# Patient Record
Sex: Female | Born: 1995 | Race: White | Hispanic: No | Marital: Single | Smoking: Never smoker
Health system: Southern US, Community
[De-identification: ages and names within clinical notes are randomized; demographics above are authoritative.]

## PROBLEM LIST (undated history)

## (undated) DIAGNOSIS — G40909 Epilepsy, unspecified, not intractable, without status epilepticus: Secondary | ICD-10-CM

## (undated) DIAGNOSIS — L709 Acne, unspecified: Secondary | ICD-10-CM

## (undated) HISTORY — DX: Acne, unspecified: L70.9

## (undated) HISTORY — PX: APPENDECTOMY: SHX54

## (undated) HISTORY — DX: Epilepsy, unspecified, not intractable, without status epilepticus: G40.909

---
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
## 2017-01-14 ENCOUNTER — Encounter: Payer: Self-pay | Admitting: Emergency Medicine

## 2017-01-14 ENCOUNTER — Observation Stay
Admission: EM | Admit: 2017-01-14 | Discharge: 2017-01-16 | Disposition: A | Discharge status: Home / Self Care | Payer: BC Managed Care – PPO | Attending: Surgery | Admitting: Surgery

## 2017-01-14 DIAGNOSIS — Z79899 Other long term (current) drug therapy: Secondary | ICD-10-CM | POA: Diagnosis not present

## 2017-01-14 DIAGNOSIS — K353 Acute appendicitis with localized peritonitis: Secondary | ICD-10-CM | POA: Diagnosis not present

## 2017-01-14 DIAGNOSIS — K3580 Unspecified acute appendicitis: Secondary | ICD-10-CM | POA: Diagnosis present

## 2017-01-14 DIAGNOSIS — N390 Urinary tract infection, site not specified: Secondary | ICD-10-CM | POA: Diagnosis not present

## 2017-01-14 DIAGNOSIS — K5900 Constipation, unspecified: Secondary | ICD-10-CM | POA: Diagnosis not present

## 2017-01-14 LAB — URINALYSIS, COMPLETE (UACMP) WITH MICROSCOPIC
Bilirubin Urine: NEGATIVE
Glucose, UA: NEGATIVE mg/dL
HGB URINE DIPSTICK: NEGATIVE
Ketones, ur: NEGATIVE mg/dL
Leukocytes, UA: NEGATIVE
Nitrite: NEGATIVE
PH: 6 (ref 5.0–8.0)
Protein, ur: NEGATIVE mg/dL
Specific Gravity, Urine: 1.02 (ref 1.005–1.030)

## 2017-01-14 LAB — POCT PREGNANCY, URINE: Preg Test, Ur: NEGATIVE

## 2017-01-14 NOTE — ED Notes (Signed)
Pt ambulatory to front desk, complains of low abd pain. Pt appears in no acute distress.

## 2017-01-14 NOTE — ED Provider Notes (Signed)
[facility] Emergency Department Provider Note  ____________________________________________   First MD Initiated Contact with Patient 01/14/17 2342     (approximate)  I have reviewed the triage vital signs and the nursing notes.   HISTORY  Chief Complaint Flank Pain    HPI Tammy Chandler is a 21 y.o. female who comes to the emergency department requesting a CT scan of her abdomen. She has had crampy mild constant lower abdominal pain for the past 5 days. Anus is worse when sitting up and moving improved when lying down and resting. Several days ago she went to the [facility] walking [facility] where she reportedly had labs which showed an elevated white count, an x-ray of her abdomen, a urinalysis concerning for infection. She was placed on Keflex but is noted no improvement in her symptoms. She was called back yesterday by a nurse at the clinic who advised her to come to the emergency department for CT scan of the abdomen and pelvis. She denies fevers or chills. Her pain is not postprandial. She denies nausea or vomiting. She has no abdominal surgical history. She has no flank pain. Her pain is. Her bilateral lower quadrants. Her last menstrual period was about one month ago.   History reviewed. No pertinent past medical history.  There are no active problems to display for this patient.   History reviewed. No pertinent surgical history.  Prior to Admission medications   Medication Sig Start Date End Date Taking? Authorizing Provider  Cephalexin (KEFLEX PO) Take by mouth.   Yes [provider]  polyethylene glycol (MIRALAX / GLYCOLAX) packet Take 17 g by mouth daily.   Yes [provider]  Promethazine HCl (PHENERGAN PO) Take by mouth.   Yes [provider]  zonisamide (ZONEGRAN) 100 MG capsule Take 2 capsules by mouth daily. 12/06/16   [provider]    Allergies Patient has no known allergies.  History reviewed. No  pertinent family history.  Social History Social History  Substance Use Topics  . Smoking status: Never Smoker  . Smokeless tobacco: Never Used  . Alcohol use No    Review of Systems Constitutional: No fever/chills Eyes: No visual changes. ENT: No sore throat. Cardiovascular: Denies chest pain. Respiratory: Denies shortness of breath. Gastrointestinal: Positive abdominal pain.  Positive nausea, no vomiting.  No diarrhea.  No constipation. Genitourinary: Negative for dysuria. Musculoskeletal: Negative for back pain. Skin: Negative for rash. Neurological: Negative for headaches, focal weakness or numbness.  10-point ROS otherwise negative.  ____________________________________________   PHYSICAL EXAM:  VITAL SIGNS: ED Triage Vitals  Enc Vitals Group     BP 01/14/17 2041 119/78     Pulse Rate 01/14/17 2041 89     Resp 01/14/17 2041 16     Temp 01/14/17 2041 97.9 F (36.6 C)     Temp Source 01/14/17 2041 Oral     SpO2 01/14/17 2041 99 %     Weight 01/14/17 2042 207 lb (93.9 kg)     Height 01/14/17 2042 5\' 6"  (1.676 m)     Head Circumference --      Peak Flow --      Pain Score 01/14/17 2249 5     Pain Loc --      Pain Edu? --      Excl. in GC? --     Constitutional: Alert and oriented x 4 well appearing nontoxic no diaphoresis speaks in full, clear sentences Eyes: PERRL EOMI. Head: Atraumatic. Nose: No congestion/rhinnorhea.  Mouth/Throat: No trismus Neck: No stridor.   Cardiovascular: Normal rate, regular rhythm. Grossly normal heart sounds.  Good peripheral circulation. Respiratory: Normal respiratory effort.  No retractions. Lungs CTAB and moving good air Gastrointestinal: Soft nondistended mild tenderness right lower quadrant greater than left lower quadrant with no rebound no guarding no peritonitis negative Rovsing's somewhat tender over McBurney's Musculoskeletal: No lower extremity edema   Neurologic:  Normal speech and language. No gross focal neurologic  deficits are appreciated. Skin:  Skin is warm, dry and intact. No rash noted. Psychiatric: Mood and affect are normal. Speech and behavior are normal.    ___________________________________   LABS (all labs ordered are listed, but only abnormal results are displayed)  Labs Reviewed  URINALYSIS, COMPLETE (UACMP) WITH MICROSCOPIC - Abnormal; Notable for the following:       Result Value   Color, Urine YELLOW (*)    APPearance CLEAR (*)    Bacteria, UA RARE (*)    Squamous Epithelial / LPF 0-5 (*)    All other components within normal limits  HEPATIC FUNCTION PANEL - Abnormal; Notable for the following:    Bilirubin, Direct <0.1 (*)    All other components within normal limits  URINE CULTURE  CULTURE, BLOOD (ROUTINE X 2)  CULTURE, BLOOD (ROUTINE X 2)  BASIC METABOLIC PANEL  LIPASE, BLOOD  CBC WITH DIFFERENTIAL/PLATELET  LACTIC ACID, PLASMA  LACTIC ACID, PLASMA  POC URINE PREG, ED  POCT PREGNANCY, URINE  TYPE AND SCREEN    Labs unremarkable no signs of UTI __________________________________________  EKG   ____________________________________________  RADIOLOGY  CT scan confirms retrocecal appendicitis ____________________________________________   PROCEDURES  Procedure(s) performed: no  Procedures  Critical Care performed: yes  CRITICAL CARE Performed by: Merrily Brittle   Total critical care time: 35 minutes  Critical care time was exclusive of separately billable procedures and treating other patients.  Critical care was necessary to treat or prevent imminent or life-threatening deterioration.  Critical care was time spent personally by me on the following activities: development of treatment plan with patient and/or surrogate as well as nursing, discussions with consultants, evaluation of patient's response to treatment, examination of patient, obtaining history from patient or surrogate, ordering and performing treatments and interventions, ordering  and review of laboratory studies, ordering and review of radiographic studies, pulse oximetry and re-evaluation of patient's condition.   Observation: no ____________________________________________   INITIAL IMPRESSION / ASSESSMENT AND PLAN / ED COURSE  Pertinent labs & imaging results that were available during my care of the patient were reviewed by me and considered in my medical decision making (see chart for details).  The patient arrives very well-appearing with a benign abdominal exam. I have a low suspicion for appendicitis, however she has been taking antibiotics for several days which could obscure her symptoms. I offered the patient an abdominal recheck versus labs and an abdominal recheck versus CT scan today and she opted for CT.   _----------------------------------------- 3:11 AM on 01/15/2017 -----------------------------------------  Was called by the radiologist that the patient has acute appendicitis. I then discussed the case with on-call surgeon Dr. Everlene Farrier who will kindly come evaluate the patient.  He requests zosyn. ___________________________________________   FINAL CLINICAL IMPRESSION(S) / ED DIAGNOSES  Final diagnoses:  Acute appendicitis, unspecified acute appendicitis type      NEW MEDICATIONS STARTED DURING THIS VISIT:  New Prescriptions   No medications on file     Note:  This document was prepared using Dragon voice recognition software and may  include unintentional dictation errors.     Merrily Brittle, MD 01/15/17 [phone]

## 2017-01-14 NOTE — ED Triage Notes (Signed)
Pt ambulatory to triage with steady gait, no distress noted. Pt c/o of left and right flank pain since Tuesday. Pt seen at [facility] on 01/12/17, blood, urine and xray performed. Pt diagnosed with UTI, given Keflex, Phenergan and Miralax. Pt to ED today due to symptoms not resolving with medication. Pts last BM today, normal per pt.

## 2017-01-14 NOTE — ED Notes (Signed)
Pt states she would like to get something to eat from vending machine. Explanation of NPO status explained to pt who verbalizes understanding. Pt updated on on delay. Pt verbailzes understanding.

## 2017-01-15 ENCOUNTER — Emergency Department: Payer: BC Managed Care – PPO

## 2017-01-15 ENCOUNTER — Observation Stay: Payer: BC Managed Care – PPO | Admitting: Anesthesiology

## 2017-01-15 ENCOUNTER — Encounter: Payer: Self-pay | Admitting: Radiology

## 2017-01-15 ENCOUNTER — Encounter
Admission: EM | Disposition: A | Discharge status: Home / Self Care | Payer: Self-pay | Source: Home / Self Care | Attending: Emergency Medicine

## 2017-01-15 DIAGNOSIS — K3580 Unspecified acute appendicitis: Secondary | ICD-10-CM | POA: Diagnosis present

## 2017-01-15 DIAGNOSIS — K353 Acute appendicitis with localized peritonitis: Secondary | ICD-10-CM

## 2017-01-15 HISTORY — PX: LAPAROSCOPIC APPENDECTOMY: SHX408

## 2017-01-15 LAB — CBC WITH DIFFERENTIAL/PLATELET
BASOS ABS: 0 10*3/uL (ref 0–0.1)
BASOS PCT: 1 %
EOS PCT: 1 %
Eosinophils Absolute: 0.1 10*3/uL (ref 0–0.7)
HCT: 41.3 % (ref 35.0–47.0)
Hemoglobin: 14.1 g/dL (ref 12.0–16.0)
LYMPHS ABS: 3.2 10*3/uL (ref 1.0–3.6)
Lymphocytes Relative: 40 %
MCH: 29.4 pg (ref 26.0–34.0)
MCHC: 34 g/dL (ref 32.0–36.0)
MCV: 86.5 fL (ref 80.0–100.0)
Monocytes Absolute: 0.8 10*3/uL (ref 0.2–0.9)
Monocytes Relative: 10 %
NEUTROS PCT: 48 %
Neutro Abs: 3.9 10*3/uL (ref 1.4–6.5)
PLATELETS: 253 10*3/uL (ref 150–440)
RBC: 4.78 MIL/uL (ref 3.80–5.20)
RDW: 12.3 % (ref 11.5–14.5)
WBC: 8 10*3/uL (ref 3.6–11.0)

## 2017-01-15 LAB — HEPATIC FUNCTION PANEL
ALBUMIN: 4.3 g/dL (ref 3.5–5.0)
ALT: 22 U/L (ref 14–54)
AST: 16 U/L (ref 15–41)
Alkaline Phosphatase: 68 U/L (ref 38–126)
BILIRUBIN TOTAL: 0.4 mg/dL (ref 0.3–1.2)
Total Protein: 7.5 g/dL (ref 6.5–8.1)

## 2017-01-15 LAB — LACTIC ACID, PLASMA: Lactic Acid, Venous: 0.9 mmol/L (ref 0.5–1.9)

## 2017-01-15 LAB — TYPE AND SCREEN
ABO/RH(D): O NEG
ANTIBODY SCREEN: NEGATIVE

## 2017-01-15 LAB — BASIC METABOLIC PANEL
Anion gap: 5 (ref 5–15)
BUN: 7 mg/dL (ref 6–20)
CHLORIDE: 111 mmol/L (ref 101–111)
CO2: 24 mmol/L (ref 22–32)
CREATININE: 0.63 mg/dL (ref 0.44–1.00)
Calcium: 9.3 mg/dL (ref 8.9–10.3)
Glucose, Bld: 83 mg/dL (ref 65–99)
POTASSIUM: 4.2 mmol/L (ref 3.5–5.1)
SODIUM: 140 mmol/L (ref 135–145)

## 2017-01-15 LAB — LIPASE, BLOOD: LIPASE: 17 U/L (ref 11–51)

## 2017-01-15 SURGERY — APPENDECTOMY, LAPAROSCOPIC
Anesthesia: General | Wound class: Contaminated

## 2017-01-15 MED ORDER — POLYETHYLENE GLYCOL 3350 17 G PO PACK: 17.0000 g | PACK | Freq: Every day | ORAL | Status: DC | PRN

## 2017-01-15 MED ORDER — FENTANYL CITRATE (PF) 100 MCG/2ML IJ SOLN
25.0000 ug | INTRAMUSCULAR | Status: DC | PRN
  Administered 2017-01-15 (×2): 25 ug via INTRAVENOUS

## 2017-01-15 MED ORDER — GLYCOPYRROLATE 0.2 MG/ML IJ SOLN
INTRAMUSCULAR | Status: AC
  Filled 2017-01-15: qty 2

## 2017-01-15 MED ORDER — DEXAMETHASONE SODIUM PHOSPHATE 10 MG/ML IJ SOLN
INTRAMUSCULAR | Status: AC
  Filled 2017-01-15: qty 1

## 2017-01-15 MED ORDER — ACETAMINOPHEN 500 MG PO TABS
1000.0000 mg | ORAL_TABLET | Freq: Four times a day (QID) | ORAL | Status: DC
  Administered 2017-01-15 – 2017-01-16 (×4): 1000 mg via ORAL
  Filled 2017-01-15 (×4): qty 2

## 2017-01-15 MED ORDER — MIDAZOLAM HCL 2 MG/2ML IJ SOLN
INTRAMUSCULAR | Status: AC
  Filled 2017-01-15: qty 2

## 2017-01-15 MED ORDER — BUPIVACAINE-EPINEPHRINE (PF) 0.5% -1:200000 IJ SOLN
INTRAMUSCULAR | Status: DC | PRN
  Administered 2017-01-15: 20 mL

## 2017-01-15 MED ORDER — MIDAZOLAM HCL 2 MG/2ML IJ SOLN
INTRAMUSCULAR | Status: DC | PRN
  Administered 2017-01-15: 2 mg via INTRAVENOUS

## 2017-01-15 MED ORDER — PROPOFOL 10 MG/ML IV BOLUS
INTRAVENOUS | Status: DC | PRN
  Administered 2017-01-15: 180 mg via INTRAVENOUS

## 2017-01-15 MED ORDER — HYDROMORPHONE HCL 1 MG/ML IJ SOLN
0.5000 mg | INTRAMUSCULAR | Status: DC | PRN
  Filled 2017-01-15: qty 0.5

## 2017-01-15 MED ORDER — ROCURONIUM BROMIDE 100 MG/10ML IV SOLN
INTRAVENOUS | Status: AC
  Filled 2017-01-15: qty 1

## 2017-01-15 MED ORDER — PIPERACILLIN-TAZOBACTAM 3.375 G IVPB 30 MIN
3.3750 g | Freq: Three times a day (TID) | INTRAVENOUS | Status: DC
  Administered 2017-01-15 – 2017-01-16 (×3): 3.375 g via INTRAVENOUS
  Filled 2017-01-15 (×6): qty 50

## 2017-01-15 MED ORDER — ONDANSETRON HCL 4 MG/2ML IJ SOLN: 4.0000 mg | Freq: Four times a day (QID) | INTRAMUSCULAR | Status: DC | PRN

## 2017-01-15 MED ORDER — NEOSTIGMINE METHYLSULFATE 10 MG/10ML IV SOLN
INTRAVENOUS | Status: DC | PRN
  Administered 2017-01-15: 4 mg via INTRAVENOUS

## 2017-01-15 MED ORDER — LACTATED RINGERS IV SOLN
125.0000 mL/h | INTRAVENOUS | Status: DC
  Administered 2017-01-15: 06:00:00 via INTRAVENOUS
  Administered 2017-01-15: 125 mL/h via INTRAVENOUS
  Administered 2017-01-15: 05:00:00 via INTRAVENOUS

## 2017-01-15 MED ORDER — GLYCOPYRROLATE 0.2 MG/ML IJ SOLN
INTRAMUSCULAR | Status: DC | PRN
  Administered 2017-01-15: 0.4 mg via INTRAVENOUS

## 2017-01-15 MED ORDER — KETOROLAC TROMETHAMINE 30 MG/ML IJ SOLN
30.0000 mg | Freq: Four times a day (QID) | INTRAMUSCULAR | Status: DC
  Administered 2017-01-15 – 2017-01-16 (×4): 30 mg via INTRAVENOUS
  Filled 2017-01-15 (×4): qty 1

## 2017-01-15 MED ORDER — OXYCODONE HCL 5 MG PO TABS: 5.0000 mg | ORAL_TABLET | Freq: Once | ORAL | Status: DC | PRN

## 2017-01-15 MED ORDER — SUCCINYLCHOLINE CHLORIDE 20 MG/ML IJ SOLN
INTRAMUSCULAR | Status: AC
  Filled 2017-01-15: qty 1

## 2017-01-15 MED ORDER — PANTOPRAZOLE SODIUM 40 MG IV SOLR
40.0000 mg | Freq: Every day | INTRAVENOUS | Status: DC
  Administered 2017-01-15: 40 mg via INTRAVENOUS
  Filled 2017-01-15: qty 40

## 2017-01-15 MED ORDER — ZONISAMIDE 100 MG PO CAPS
200.0000 mg | ORAL_CAPSULE | Freq: Every day | ORAL | Status: DC
  Filled 2017-01-15: qty 2

## 2017-01-15 MED ORDER — SUCCINYLCHOLINE CHLORIDE 20 MG/ML IJ SOLN
INTRAMUSCULAR | Status: DC | PRN
  Administered 2017-01-15: 100 mg via INTRAVENOUS

## 2017-01-15 MED ORDER — IOPAMIDOL (ISOVUE-300) INJECTION 61%
100.0000 mL | Freq: Once | INTRAVENOUS | Status: AC | PRN
  Administered 2017-01-15: 100 mL via INTRAVENOUS

## 2017-01-15 MED ORDER — ONDANSETRON HCL 4 MG/2ML IJ SOLN
INTRAMUSCULAR | Status: DC | PRN
  Administered 2017-01-15: 4 mg via INTRAVENOUS

## 2017-01-15 MED ORDER — ONDANSETRON HCL 4 MG/2ML IJ SOLN
INTRAMUSCULAR | Status: AC
  Filled 2017-01-15: qty 2

## 2017-01-15 MED ORDER — IOPAMIDOL (ISOVUE-300) INJECTION 61%
30.0000 mL | Freq: Once | INTRAVENOUS | Status: AC | PRN
  Administered 2017-01-15: 30 mL via ORAL

## 2017-01-15 MED ORDER — ZONISAMIDE 100 MG PO CAPS
100.0000 mg | ORAL_CAPSULE | Freq: Every day | ORAL | Status: DC
  Administered 2017-01-15 – 2017-01-16 (×2): 100 mg via ORAL
  Filled 2017-01-15 (×3): qty 1

## 2017-01-15 MED ORDER — DEXAMETHASONE SODIUM PHOSPHATE 10 MG/ML IJ SOLN
INTRAMUSCULAR | Status: DC | PRN
  Administered 2017-01-15: 10 mg via INTRAVENOUS

## 2017-01-15 MED ORDER — PROPOFOL 10 MG/ML IV BOLUS
INTRAVENOUS | Status: AC
  Filled 2017-01-15: qty 20

## 2017-01-15 MED ORDER — FENTANYL CITRATE (PF) 250 MCG/5ML IJ SOLN
INTRAMUSCULAR | Status: AC
  Filled 2017-01-15: qty 5

## 2017-01-15 MED ORDER — BUPIVACAINE-EPINEPHRINE (PF) 0.5% -1:200000 IJ SOLN
INTRAMUSCULAR | Status: AC
  Filled 2017-01-15: qty 30

## 2017-01-15 MED ORDER — SODIUM CHLORIDE 0.9 % IV BOLUS (SEPSIS)
2000.0000 mL | Freq: Once | INTRAVENOUS | Status: AC
  Administered 2017-01-15: 2000 mL via INTRAVENOUS

## 2017-01-15 MED ORDER — NEOSTIGMINE METHYLSULFATE 10 MG/10ML IV SOLN
INTRAVENOUS | Status: AC
  Filled 2017-01-15: qty 1

## 2017-01-15 MED ORDER — OXYCODONE HCL 5 MG/5ML PO SOLN: 5.0000 mg | Freq: Once | ORAL | Status: DC | PRN

## 2017-01-15 MED ORDER — ONDANSETRON 4 MG PO TBDP: 4.0000 mg | ORAL_TABLET | Freq: Four times a day (QID) | ORAL | Status: DC | PRN

## 2017-01-15 MED ORDER — FENTANYL CITRATE (PF) 100 MCG/2ML IJ SOLN
INTRAMUSCULAR | Status: DC | PRN
  Administered 2017-01-15 (×2): 50 ug via INTRAVENOUS
  Administered 2017-01-15 (×3): 25 ug via INTRAVENOUS
  Administered 2017-01-15: 50 ug via INTRAVENOUS
  Administered 2017-01-15: 25 ug via INTRAVENOUS

## 2017-01-15 MED ORDER — ENOXAPARIN SODIUM 40 MG/0.4ML ~~LOC~~ SOLN
40.0000 mg | SUBCUTANEOUS | Status: DC
  Administered 2017-01-15: 40 mg via SUBCUTANEOUS
  Filled 2017-01-15 (×2): qty 0.4

## 2017-01-15 MED ORDER — PIPERACILLIN-TAZOBACTAM 3.375 G IVPB 30 MIN
3.3750 g | Freq: Once | INTRAVENOUS | Status: AC
  Administered 2017-01-15: 3.375 g via INTRAVENOUS
  Filled 2017-01-15: qty 50

## 2017-01-15 MED ORDER — KETOROLAC TROMETHAMINE 30 MG/ML IJ SOLN
INTRAMUSCULAR | Status: AC
  Filled 2017-01-15: qty 1

## 2017-01-15 MED ORDER — SODIUM CHLORIDE 0.9 % IV SOLN
INTRAVENOUS | Status: DC | PRN
  Administered 2017-01-15: 05:00:00 via INTRAVENOUS

## 2017-01-15 MED ORDER — FENTANYL CITRATE (PF) 100 MCG/2ML IJ SOLN
INTRAMUSCULAR | Status: AC
  Administered 2017-01-15: 25 ug via INTRAVENOUS
  Filled 2017-01-15: qty 2

## 2017-01-15 MED ORDER — PHENYLEPHRINE HCL 10 MG/ML IJ SOLN
INTRAMUSCULAR | Status: DC | PRN
  Administered 2017-01-15: 200 ug via INTRAVENOUS

## 2017-01-15 MED ORDER — KETOROLAC TROMETHAMINE 30 MG/ML IJ SOLN
INTRAMUSCULAR | Status: DC | PRN
  Administered 2017-01-15: 30 mg via INTRAVENOUS

## 2017-01-15 MED ORDER — ROCURONIUM BROMIDE 100 MG/10ML IV SOLN
INTRAVENOUS | Status: DC | PRN
  Administered 2017-01-15: 20 mg via INTRAVENOUS

## 2017-01-15 MED ORDER — SODIUM CHLORIDE 0.9 % IV BOLUS (SEPSIS)
1000.0000 mL | Freq: Once | INTRAVENOUS | Status: AC
Start: 2017-01-15 — End: 2017-01-15
  Administered 2017-01-15: 1000 mL via INTRAVENOUS

## 2017-01-15 MED ORDER — OXYCODONE HCL 5 MG PO TABS
5.0000 mg | ORAL_TABLET | ORAL | Status: DC | PRN
  Administered 2017-01-15 – 2017-01-16 (×3): 5 mg via ORAL
  Filled 2017-01-15 (×3): qty 1

## 2017-01-15 MED ORDER — LIDOCAINE HCL (CARDIAC) 20 MG/ML IV SOLN
INTRAVENOUS | Status: DC | PRN
  Administered 2017-01-15: 100 mg via INTRAVENOUS

## 2017-01-15 SURGICAL SUPPLY — 38 items
CANISTER SUCT 1200ML W/VALVE (MISCELLANEOUS) ×3 IMPLANT
CHLORAPREP W/TINT 26ML (MISCELLANEOUS) ×3 IMPLANT
CUTTER FLEX LINEAR 45M (STAPLE) IMPLANT
DERMABOND ADVANCED (GAUZE/BANDAGES/DRESSINGS) ×2
DERMABOND ADVANCED .7 DNX12 (GAUZE/BANDAGES/DRESSINGS) ×1 IMPLANT
ELECT CAUTERY BLADE 6.4 (BLADE) ×3 IMPLANT
ELECT REM PT RETURN 9FT ADLT (ELECTROSURGICAL) ×3
ELECTRODE REM PT RTRN 9FT ADLT (ELECTROSURGICAL) ×1 IMPLANT
ENDOPOUCH RETRIEVER 10 (MISCELLANEOUS) ×3 IMPLANT
GLOVE SURG SYN 7.0 (GLOVE) ×9 IMPLANT
GLOVE SURG SYN 7.5  E (GLOVE) ×4
GLOVE SURG SYN 7.5 E (GLOVE) ×2 IMPLANT
GOWN STRL REUS W/ TWL LRG LVL3 (GOWN DISPOSABLE) ×2 IMPLANT
GOWN STRL REUS W/TWL LRG LVL3 (GOWN DISPOSABLE) ×4
IRRIGATION STRYKERFLOW (MISCELLANEOUS) IMPLANT
IRRIGATOR STRYKERFLOW (MISCELLANEOUS)
IV NS 1000ML (IV SOLUTION) ×2
IV NS 1000ML BAXH (IV SOLUTION) ×1 IMPLANT
KIT RM TURNOVER STRD PROC AR (KITS) ×3 IMPLANT
LABEL OR SOLS (LABEL) ×3 IMPLANT
LIGASURE LAP MARYLAND 5MM 37CM (ELECTROSURGICAL) ×3 IMPLANT
NEEDLE HYPO 22GX1.5 SAFETY (NEEDLE) ×3 IMPLANT
NS IRRIG 500ML POUR BTL (IV SOLUTION) ×3 IMPLANT
PACK LAP CHOLECYSTECTOMY (MISCELLANEOUS) ×3 IMPLANT
PENCIL ELECTRO HAND CTR (MISCELLANEOUS) ×3 IMPLANT
RELOAD 45 VASCULAR/THIN (ENDOMECHANICALS) IMPLANT
RELOAD STAPLE TA45 3.5 REG BLU (ENDOMECHANICALS) ×6 IMPLANT
SCISSORS METZENBAUM CVD 33 (INSTRUMENTS) ×3 IMPLANT
SLEEVE ADV FIXATION 5X100MM (TROCAR) ×6 IMPLANT
SUT MNCRL 4-0 (SUTURE) ×2
SUT MNCRL 4-0 27XMFL (SUTURE) ×1
SUT VICRYL 0 AB UR-6 (SUTURE) ×3 IMPLANT
SUTURE MNCRL 4-0 27XMF (SUTURE) ×1 IMPLANT
TRAY FOLEY W/METER SILVER 16FR (SET/KITS/TRAYS/PACK) ×3 IMPLANT
TROCAR 130MM GELPORT  DAV (MISCELLANEOUS) IMPLANT
TROCAR XCEL BLUNT TIP 100MML (ENDOMECHANICALS) ×3 IMPLANT
TROCAR Z-THREAD OPTICAL 5X100M (TROCAR) ×3 IMPLANT
TUBING INSUFFLATOR HI FLOW (MISCELLANEOUS) ×3 IMPLANT

## 2017-01-15 NOTE — Anesthesia Post-op Follow-up Note (Cosign Needed)
Anesthesia QCDR form completed.        

## 2017-01-15 NOTE — Op Note (Signed)
  Procedure Date:  01/15/2017  Pre-operative Diagnosis:  Acute appendicitis  Post-operative Diagnosis:  Acute appendicitis  Procedure:  Laparoscopic appendectomy  Surgeon:  Howie IllJose Luis Kimiah Hibner, MD  Anesthesia:  General endotracheal  Estimated Blood Loss:  15 ml  Specimens:  appendix  Complications:  None  Indications for Procedure:  This is a 21 y.o. female who presents with abdominal pain and workup revealing acute appendicitis.  The options of surgery versus observation were reviewed with the patient and/or family. The risks of bleeding, infection, recurrence of symptoms, negative laparoscopy, potential for an open procedure, bowel injury, abscess or infection, were all discussed with the patient and she was willing to proceed.  Description of Procedure: The patient was correctly identified in the preoperative area and brought into the operating room.  The patient was placed supine with VTE prophylaxis in place.  Appropriate time-outs were performed.  Anesthesia was induced and the patient was intubated.  Foley catheter was placed.  Appropriate antibiotics were infused.  The abdomen was prepped and draped in a sterile fashion. An infraumbilical incision was made. A cutdown technique was used to enter the abdominal cavity without injury, and a Hasson trocar was inserted.  Pneumoperitoneum was obtained with appropriate opening pressures.  Two 5-mm ports were placed in the suprapubic and left lateral positions under direct visualization.  The right lower quadrant was inspected and the appendix was confirmed to be in retrocecal position.  The right colon was mobilized off the right abdominal wall using the LigaSure device.  The appendix was then identified and found to be acutely inflamed.  The appendix was carefully dissected off the abdominal wall and side wall of right colon, where adhesions had been formed. The base of the appendix was dissected out and divided with a standard load Endo GIA.  The mesoappendix was divided using the LigaSure.  The appendix was placed in an Endocatch bag and brought out through the umbilical incision.  The right lower quadrant was then inspected again revealing an intact staple line, no bleeding, and no bowel injury.  The right lower quadrant and pelvis were thoroughly irrigated until clear fluid returned.  The 5 mm ports were removed under direct visualization and the Hasson trocar was removed.  The fascial opening was closed using 0 vicryl suture.  Local anesthetic was infused in all incisions and the incisions were closed with 4-0 Monocryl.  The wounds were cleaned and sealed with DermaBond.  Foley catheter was removed and the patient was emerged from anesthesia and extubated and brought to the recovery room for further management.  The patient tolerated the procedure well and all counts were correct at the end of the case.   Howie IllJose Luis Khushboo Chuck, MD

## 2017-01-15 NOTE — ED Notes (Signed)
Surgeon at bedside.  

## 2017-01-15 NOTE — ED Notes (Signed)
Patient taken to OR by this RN

## 2017-01-15 NOTE — Anesthesia Preprocedure Evaluation (Signed)
Anesthesia Evaluation  Patient identified by MRN, date of birth, ID band Patient awake    Reviewed: Allergy & Precautions, H&P , NPO status , Patient's Chart, lab work & pertinent test results  Airway Mallampati: III  TM Distance: >3 FB Neck ROM: full    Dental  (+) Chipped   Pulmonary neg pulmonary ROS, neg shortness of breath,    Pulmonary exam normal breath sounds clear to auscultation       Cardiovascular Exercise Tolerance: Good (-) angina(-) Past MI and (-) DOE negative cardio ROS Normal cardiovascular exam Rhythm:regular Rate:Normal     Neuro/Psych Seizures -, Well Controlled,  negative psych ROS   GI/Hepatic negative GI ROS, Neg liver ROS, neg GERD  ,  Endo/Other  negative endocrine ROS  Renal/GU      Musculoskeletal   Abdominal   Peds  Hematology negative hematology ROS (+)   Anesthesia Other Findings   History reviewed. No pertinent surgical history.  BMI    Body Mass Index:  33.41 kg/m      Reproductive/Obstetrics negative OB ROS                             Anesthesia Physical Anesthesia Plan  ASA: III  Anesthesia Plan: General ETT, Rapid Sequence and Cricoid Pressure   Post-op Pain Management:    Induction: Intravenous  Airway Management Planned: Oral ETT  Additional Equipment:   Intra-op Plan:   Post-operative Plan: Extubation in OR  Informed Consent: I have reviewed the patients History and Physical, chart, labs and discussed the procedure including the risks, benefits and alternatives for the proposed anesthesia with the patient or authorized representative who has indicated his/her understanding and acceptance.   Dental Advisory Given  Plan Discussed with: Anesthesiologist, CRNA and Surgeon  Anesthesia Plan Comments: (Patient consented for risks of anesthesia including but not limited to:  - adverse reactions to medications - damage to teeth, lips  or other oral mucosa - sore throat or hoarseness - Damage to heart, brain, lungs or loss of life  Patient voiced understanding.)        Anesthesia Quick Evaluation

## 2017-01-15 NOTE — ED Notes (Signed)
Informed consent obtained by this RN

## 2017-01-15 NOTE — ED Notes (Signed)
Called [facility] to tell them they will be receiving patient after the OR has completed her procedure.

## 2017-01-15 NOTE — ED Notes (Signed)
Patient @ CT

## 2017-01-15 NOTE — Transfer of Care (Signed)
Immediate Anesthesia Transfer of Care Note  Patient: Tammy Chandler  Procedure(s) Performed: Procedure(s): APPENDECTOMY LAPAROSCOPIC (N/A)  Patient Location: PACU  Anesthesia Type:General  Level of Consciousness: awake, alert  and oriented  Airway & Oxygen Therapy: Patient Spontanous Breathing and Patient connected to face mask oxygen  Post-op Assessment: Report given to RN and Post -op Vital signs reviewed and stable  Post vital signs: Reviewed and stable  Last Vitals:  Vitals:   01/15/17 0400 01/15/17 0638  BP: 107/69 117/67  Pulse: 80 97  Resp:  (!) 21  Temp:  36.2 C    Last Pain:  Vitals:   01/15/17 0638  TempSrc:   PainSc: Asleep         Complications: No apparent anesthesia complications

## 2017-01-15 NOTE — H&P (Signed)
Date of Admission:  01/15/2017  Reason for Admission:  Acute appendicitis  History of Present Illness: Tammy Chandler is a 21 y.o. female who presents with a 3 day history of abdominal pain.  The patient had initially [facility] on 5/10 with abdominal pain and she was started on Keflex due to concerns for a possible UTI.  Her pain was initially periumbilical, which since has mostly migrated to the right lower quadrant.  She had two episodes of emesis on 5/10, but none since, though she has had some intermittent nausea.  She has been able to eat without worsening pain and had dinner last night.  Reports her pain is worse when moving and better when lying down.    With the Keflex course, her pain has not improved and she was told yesterday to come to the ED for further evaluation.  On workup, she has a normal WBC of 8 and otherwise normal labs.  CT scan does show acute appendicitis without abscess or perforation.  She denies having any fevers or chills, chest pain or shortness of breath, diarrhea, dysuria, hematuria.  Did have some constipation which was improved by Miralax this week.  Does report some discomfort on the left lower quadrant, but the majority of the pain is on the right.  Past Medical History: --Seizures  Past Surgical History: --Wisdom teeth extraction  Home Medications: Prior to Admission medications   Medication Sig Start Date End Date Taking? Authorizing Provider  Cephalexin (KEFLEX PO) Take by mouth.   Yes [provider]  polyethylene glycol (MIRALAX / GLYCOLAX) packet Take 17 g by mouth daily.   Yes [provider]  Promethazine HCl (PHENERGAN PO) Take by mouth.   Yes [provider]  zonisamide (ZONEGRAN) 100 MG capsule Take 2 capsules by mouth daily. 12/06/16   [provider]    Allergies: No Known Allergies  Social History:  reports that she has never smoked. She has never used smokeless tobacco. She  reports that she does not drink alcohol or use drugs.   Family History: --History of CHF, cancer, and type I diabetes in family.  Review of Systems: Review of Systems  Constitutional: Negative for chills and fever.  HENT: Negative for hearing loss.   Eyes: Negative for blurred vision.  Respiratory: Negative for cough and shortness of breath.   Cardiovascular: Negative for chest pain.  Gastrointestinal: Positive for abdominal pain, constipation, nausea and vomiting. Negative for blood in stool and diarrhea.  Genitourinary: Negative for dysuria and hematuria.  Musculoskeletal: Negative for myalgias.  Skin: Negative for rash.  Neurological: Negative for dizziness.  Psychiatric/Behavioral: Negative for depression.  All other systems reviewed and are negative.   Physical Exam BP 102/65   Pulse 73   Temp 97.9 F (36.6 C) (Oral)   Resp 16   Ht 5\' 6"  (1.676 m)   Wt 93.9 kg (207 lb)   LMP 12/16/2016 (Approximate)   SpO2 98%   BMI 33.41 kg/m  CONSTITUTIONAL: No acute distress HEENT:  Normocephalic, atraumatic, extraocular motion intact. NECK: Trachea is midline, and there is no jugular venous distension.  RESPIRATORY:  Lungs are clear, and breath sounds are equal bilaterally. Normal respiratory effort without pathologic use of accessory muscles. CARDIOVASCULAR: Heart is regular without murmurs, gallops, or rubs. GI: The abdomen is soft, obese, non-distended, with tenderness to palpation in the right lower quadrant.  There are no peritoneal signs. There were no palpable masses. MUSCULOSKELETAL:  Normal muscle strength and tone  in all four extremities.  No peripheral edema or cyanosis. SKIN: Skin turgor is normal. There are no pathologic skin lesions.  NEUROLOGIC:  Motor and sensation is grossly normal.  Cranial nerves are grossly intact. PSYCH:  Alert and oriented to person, place and time. Affect is normal.  Laboratory Analysis: Results for orders placed or performed during the  hospital encounter of 01/14/17 (from the past 24 hour(s))  Urinalysis, Complete w Microscopic     Status: Abnormal   Collection Time: 01/14/17  8:42 PM  Result Value Ref Range   Color, Urine YELLOW (A) YELLOW   APPearance CLEAR (A) CLEAR   Specific Gravity, Urine 1.020 1.005 - 1.030   pH 6.0 5.0 - 8.0   Glucose, UA NEGATIVE NEGATIVE mg/dL   Hgb urine dipstick NEGATIVE NEGATIVE   Bilirubin Urine NEGATIVE NEGATIVE   Ketones, ur NEGATIVE NEGATIVE mg/dL   Protein, ur NEGATIVE NEGATIVE mg/dL   Nitrite NEGATIVE NEGATIVE   Leukocytes, UA NEGATIVE NEGATIVE   RBC / HPF 0-5 0 - 5 RBC/hpf   WBC, UA 0-5 0 - 5 WBC/hpf   Bacteria, UA RARE (A) NONE SEEN   Squamous Epithelial / LPF 0-5 (A) NONE SEEN   Mucous PRESENT   Pregnancy, urine POC     Status: None   Collection Time: 01/14/17  8:54 PM  Result Value Ref Range   Preg Test, Ur NEGATIVE NEGATIVE  Basic metabolic panel     Status: None   Collection Time: 01/15/17 12:42 AM  Result Value Ref Range   Sodium 140 135 - 145 mmol/L   Potassium 4.2 3.5 - 5.1 mmol/L   Chloride 111 101 - 111 mmol/L   CO2 24 22 - 32 mmol/L   Glucose, Bld 83 65 - 99 mg/dL   BUN 7 6 - 20 mg/dL   Creatinine, Ser 4.09 0.44 - 1.00 mg/dL   Calcium 9.3 8.9 - 81.1 mg/dL   GFR calc non Af Amer >60 >60 mL/min   GFR calc Af Amer >60 >60 mL/min   Anion gap 5 5 - 15  Hepatic function panel     Status: Abnormal   Collection Time: 01/15/17 12:42 AM  Result Value Ref Range   Total Protein 7.5 6.5 - 8.1 g/dL   Albumin 4.3 3.5 - 5.0 g/dL   AST 16 15 - 41 U/L   ALT 22 14 - 54 U/L   Alkaline Phosphatase 68 38 - 126 U/L   Total Bilirubin 0.4 0.3 - 1.2 mg/dL   Bilirubin, Direct <9.1 (L) 0.1 - 0.5 mg/dL   Indirect Bilirubin NOT CALCULATED 0.3 - 0.9 mg/dL  Lipase, blood     Status: None   Collection Time: 01/15/17 12:42 AM  Result Value Ref Range   Lipase 17 11 - 51 U/L  CBC with Differential     Status: None   Collection Time: 01/15/17 12:42 AM  Result Value Ref Range   WBC  8.0 3.6 - 11.0 K/uL   RBC 4.78 3.80 - 5.20 MIL/uL   Hemoglobin 14.1 12.0 - 16.0 g/dL   HCT 47.8 29.5 - 62.1 %   MCV 86.5 80.0 - 100.0 fL   MCH 29.4 26.0 - 34.0 pg   MCHC 34.0 32.0 - 36.0 g/dL   RDW 30.8 65.7 - 84.6 %   Platelets 253 150 - 440 K/uL   Neutrophils Relative % 48 %   Neutro Abs 3.9 1.4 - 6.5 K/uL   Lymphocytes Relative 40 %   Lymphs Abs 3.2 1.0 -  3.6 K/uL   Monocytes Relative 10 %   Monocytes Absolute 0.8 0.2 - 0.9 K/uL   Eosinophils Relative 1 %   Eosinophils Absolute 0.1 0 - 0.7 K/uL   Basophils Relative 1 %   Basophils Absolute 0.0 0 - 0.1 K/uL  Type and screen [facility]     Status: None (Preliminary result)   Collection Time: 01/15/17  3:23 AM  Result Value Ref Range   ABO/RH(D) PENDING    Antibody Screen PENDING    Sample Expiration 01/18/2017     Imaging: Ct Abdomen Pelvis W Contrast  Result Date: 01/15/2017 CLINICAL DATA:  Acute onset of bilateral flank pain. Recently diagnosed with urinary tract infection. Initial encounter. EXAM: CT ABDOMEN AND PELVIS WITH CONTRAST TECHNIQUE: Multidetector CT imaging of the abdomen and pelvis was performed using the standard protocol following bolus administration of intravenous contrast. CONTRAST:  ISOVUE-300 IOPAMIDOL (ISOVUE-300) INJECTION 61% COMPARISON:  None. FINDINGS: Lower chest: Minimal bibasilar atelectasis is noted. The visualized portions of the mediastinum are unremarkable. Hepatobiliary: The liver is unremarkable in appearance. The gallbladder is unremarkable in appearance. The common bile duct remains normal in caliber. Pancreas: The pancreas is within normal limits. Spleen: The spleen is unremarkable in appearance. Adrenals/Urinary Tract: The adrenal glands are unremarkable in appearance. The kidneys are within normal limits. There is no evidence of hydronephrosis. No renal or ureteral stones are identified. No perinephric stranding is seen. Stomach/Bowel: There is dilatation of the  appendix to 1.3 cm in diameter, with surrounding trace fluid, compatible with acute appendicitis. Mildly prominent pericecal nodes are seen. There is no evidence of perforation or abscess formation at this time. The appendix is retrocecal in nature. The colon is grossly unremarkable in appearance. Contrast progresses to the level of the ascending colon. Visualized small bowel loops are grossly unremarkable. The stomach is unremarkable in appearance. Vascular/Lymphatic: The abdominal aorta is unremarkable in appearance. The inferior vena cava is grossly unremarkable. No retroperitoneal lymphadenopathy is seen. No pelvic sidewall lymphadenopathy is identified. Reproductive: The bladder is moderately distended and grossly unremarkable. The uterus is unremarkable in appearance. The ovaries are relatively symmetric. No suspicious adnexal masses are seen. Other: No additional soft tissue abnormalities are seen. Musculoskeletal: No acute osseous abnormalities are identified. The visualized musculature is unremarkable in appearance. IMPRESSION: Acute appendicitis, with dilatation of the appendix to 1.3 cm in diameter, and surrounding trace fluid. Mildly prominent pericecal nodes noted. No evidence of perforation or abscess formation at this time. The appendix is retrocecal in nature. These results were called by telephone at the time of interpretation on 01/15/2017 at 3:00 am to Dr. Merrily Brittle, who verbally acknowledged these results. Electronically Signed   By: Roanna Raider M.D.   On: 01/15/2017 03:01    Assessment and Plan: This is a 21 y.o. female who presents with acute appendicitis.  I have independently viewed the patient's imaging study as well as reviewed the patient's laboratory studies and discussed them with the patient.  Her CT scan does show inflammation of the distal appendix, with no evidence of perforation.    Discussed with the patient the role for surgery vs medical management of acute  appendicitis, with possible conservative management involving IV antibiotics with slow advancement of diet, compared to going to the operating room for a laparoscopic, possibly open appendectomy.  The risks and benefits of both treatment plans were discussed with the patient and she has elected to proceed with laparoscopic appendectomy. Risks including bleeding, infection, abscess formation, and  injury to surrounding structures were discussed with the patient.    She will continue to be NPO, with IV fluid hydration.  She is currently getting a dose of IV Zosyn in the ED.  She will have appropriate pain and nausea control.  Her home seizure medication will be continued as well.  She has been seizure free for 5-6 months and is well controlled with her current medication.   Howie Ill, MD [facility]

## 2017-01-15 NOTE — Anesthesia Procedure Notes (Signed)
Procedure Name: Intubation Date/Time: 01/15/2017 4:49 AM Performed by: Clinton Sawyer Pre-anesthesia Checklist: Patient identified, Emergency Drugs available, Suction available, Patient being monitored and Timeout performed Patient Re-evaluated:Patient Re-evaluated prior to inductionOxygen Delivery Method: Circle system utilized Preoxygenation: Pre-oxygenation with 100% oxygen Intubation Type: IV induction, Rapid sequence and Cricoid Pressure applied Laryngoscope Size: Mac and 4 Grade View: Grade I Tube type: Oral Tube size: 7.0 mm Number of attempts: 1 Airway Equipment and Method: Stylet Placement Confirmation: ETT inserted through vocal cords under direct vision,  positive ETCO2,  CO2 detector and breath sounds checked- equal and bilateral Secured at: 22 cm Tube secured with: Tape Dental Injury: Teeth and Oropharynx as per pre-operative assessment

## 2017-01-16 ENCOUNTER — Encounter: Payer: Self-pay | Admitting: Surgery

## 2017-01-16 LAB — URINE CULTURE

## 2017-01-16 MED ORDER — OXYCODONE HCL 5 MG PO TABS: 5.0000 mg | ORAL_TABLET | ORAL | 0 refills | Status: DC | PRN

## 2017-01-16 NOTE — Progress Notes (Signed)
Patient discharged to home as ordered. Prescription given and discharge instructions given as ordered. Follow up appointments givens ordered. Patient denies pain upon discharge, dressings to abdomen clean dry and intact.

## 2017-01-16 NOTE — Discharge Summary (Signed)
Physician Discharge Summary  Patient ID: Tammy Chandler MRN: [id number] DOB/AGE: September 14, 1995 20 y.o.  Admit date: 01/14/2017 Discharge date: 01/16/2017   Discharge Diagnoses:  Active Problems:   Acute appendicitis   Procedures:Laparoscopic appendectomy  Hospital Course: This patient with acute appendicitis admitted to the hospital was taken to the operating room with a diagnosis of acute appendicitis. Laparoscopic appendectomy confirm the of diagnosis and treated the patient. She made a non-, located postoperative recovery is tolerating regular diet and is discharged today with oral analgesics with instructions to resume home medications and to shower and follow up in our office in 10 days.  Consults: None  Disposition: Final discharge disposition not confirmed   Allergies as of 01/16/2017   No Known Allergies     Medication List    TAKE these medications   oxyCODONE 5 MG immediate release tablet Commonly known as:  Oxy IR/ROXICODONE Take 1-2 tablets (5-10 mg total) by mouth every 4 (four) hours as needed for moderate pain.   PHENERGAN PO Take by mouth.   polyethylene glycol packet Commonly known as:  MIRALAX / GLYCOLAX Take 17 g by mouth daily.   zonisamide 100 MG capsule Commonly known as:  ZONEGRAN Take 1 capsule by mouth daily.      Follow-up Information    Lattie Hawooper, TRUE Shackleford E, MD Follow up in 10 day(s).   Specialty:  Surgery Contact information: [address] [place] [address] [phone]           Lattie Hawichard E Crysta Gulick, MD, FACS

## 2017-01-16 NOTE — Discharge Instructions (Signed)
May shower in 24 hours. °Resume all home medications. °Follow-up with Dr. Jamez Ambrocio in 10 days. °

## 2017-01-16 NOTE — Care Management (Signed)
Notified that patient has [employer] insurance.  Patient Registration to update chart

## 2017-01-16 NOTE — Progress Notes (Signed)
1 Day Post-Op  Subjective: Patient status post laparoscopic appendectomy. She feels much better today and is ready to go home she is tolerating a regular diet.  Objective: Vital signs in last 24 hours: Temp:  [98.1 F (36.7 C)-98.4 F (36.9 C)] 98.2 F (36.8 C) (05/14 0426) Pulse Rate:  [74-110] 74 (05/14 0426) Resp:  [19-20] 19 (05/14 0426) BP: (106-116)/(58-76) 107/64 (05/14 0426) SpO2:  [96 %-98 %] 98 % (05/14 0426) Last BM Date: 01/14/17  Intake/Output from previous day: 05/13 0701 - 05/14 0700 In: 2095 [P.O.:840; I.V.:1195; IV Piggyback:60] Out: 0  Intake/Output this shift: No intake/output data recorded.  Physical exam:  Abdomen is soft nontender wounds are dressed. Dermabond in place.  Lab Results: CBC   Recent Labs  01/15/17 0042  WBC 8.0  HGB 14.1  HCT 41.3  PLT 253   BMET  Recent Labs  01/15/17 0042  NA 140  K 4.2  CL 111  CO2 24  GLUCOSE 83  BUN 7  CREATININE 0.63  CALCIUM 9.3   PT/INR No results for input(s): LABPROT, INR in the last 72 hours. ABG No results for input(s): PHART, HCO3 in the last 72 hours.  Invalid input(s): PCO2, PO2  Studies/Results: Ct Abdomen Pelvis W Contrast  Result Date: 01/15/2017 CLINICAL DATA:  Acute onset of bilateral flank pain. Recently diagnosed with urinary tract infection. Initial encounter. EXAM: CT ABDOMEN AND PELVIS WITH CONTRAST TECHNIQUE: Multidetector CT imaging of the abdomen and pelvis was performed using the standard protocol following bolus administration of intravenous contrast. CONTRAST:  ISOVUE-300 IOPAMIDOL (ISOVUE-300) INJECTION 61% COMPARISON:  None. FINDINGS: Lower chest: Minimal bibasilar atelectasis is noted. The visualized portions of the mediastinum are unremarkable. Hepatobiliary: The liver is unremarkable in appearance. The gallbladder is unremarkable in appearance. The common bile duct remains normal in caliber. Pancreas: The pancreas is within normal limits. Spleen: The spleen is  unremarkable in appearance. Adrenals/Urinary Tract: The adrenal glands are unremarkable in appearance. The kidneys are within normal limits. There is no evidence of hydronephrosis. No renal or ureteral stones are identified. No perinephric stranding is seen. Stomach/Bowel: There is dilatation of the appendix to 1.3 cm in diameter, with surrounding trace fluid, compatible with acute appendicitis. Mildly prominent pericecal nodes are seen. There is no evidence of perforation or abscess formation at this time. The appendix is retrocecal in nature. The colon is grossly unremarkable in appearance. Contrast progresses to the level of the ascending colon. Visualized small bowel loops are grossly unremarkable. The stomach is unremarkable in appearance. Vascular/Lymphatic: The abdominal aorta is unremarkable in appearance. The inferior vena cava is grossly unremarkable. No retroperitoneal lymphadenopathy is seen. No pelvic sidewall lymphadenopathy is identified. Reproductive: The bladder is moderately distended and grossly unremarkable. The uterus is unremarkable in appearance. The ovaries are relatively symmetric. No suspicious adnexal masses are seen. Other: No additional soft tissue abnormalities are seen. Musculoskeletal: No acute osseous abnormalities are identified. The visualized musculature is unremarkable in appearance. IMPRESSION: Acute appendicitis, with dilatation of the appendix to 1.3 cm in diameter, and surrounding trace fluid. Mildly prominent pericecal nodes noted. No evidence of perforation or abscess formation at this time. The appendix is retrocecal in nature. These results were called by telephone at the time of interpretation on 01/15/2017 at 3:00 am to Dr. Merrily Brittle, who verbally acknowledged these results. Electronically Signed   By: Roanna Raider M.D.   On: 01/15/2017 03:01    Anti-infectives: Anti-infectives    Start     Dose/Rate Route Frequency  Ordered Stop   01/15/17 1200   piperacillin-tazobactam (ZOSYN) IVPB 3.375 g     3.375 g 12.5 mL/hr over 240 Minutes Intravenous Every 8 hours 01/15/17 0409     01/15/17 0315  piperacillin-tazobactam (ZOSYN) IVPB 3.375 g     3.375 g 100 mL/hr over 30 Minutes Intravenous  Once 01/15/17 0304 01/15/17 1100      Assessment/Plan: s/p Procedure(s): APPENDECTOMY LAPAROSCOPIC   Patient doing quite well at this time she can likely be discharged after breakfast this morning she will follow-up in our office in 10 days and it with instructions to shower.  Lattie Hawichard E Camber Ninh, MD, FACS  01/16/2017

## 2017-01-16 NOTE — Care Management (Signed)
Patient status post Laparoscopic appendectomy. Patient self pay.  Provided application for [facility] and Medication [facility] as well as coupon from [email or web address] for pain medication.  Out of pocket cost $10.  RNCM signing off.

## 2017-01-17 LAB — SURGICAL PATHOLOGY

## 2017-01-17 NOTE — Anesthesia Postprocedure Evaluation (Signed)
Anesthesia Post Note  Patient: Kassie MendsKatherine L Begeman  Procedure(s) Performed: Procedure(s) (LRB): APPENDECTOMY LAPAROSCOPIC (N/A)  Patient location during evaluation: PACU Anesthesia Type: General Level of consciousness: awake and alert Pain management: pain level controlled Vital Signs Assessment: post-procedure vital signs reviewed and stable Respiratory status: spontaneous breathing, nonlabored ventilation, respiratory function stable and patient connected to nasal cannula oxygen Cardiovascular status: blood pressure returned to baseline and stable Postop Assessment: no signs of nausea or vomiting Anesthetic complications: no     Last Vitals:  Vitals:   01/15/17 2006 01/16/17 0426  BP: 108/64 107/64  Pulse: 80 74  Resp: 20 19  Temp: 36.8 C 36.8 C    Last Pain:  Vitals:   01/16/17 1159  TempSrc:   PainSc: 0-No pain                 Cleda MccreedyJoseph K Cheo Selvey

## 2017-01-19 ENCOUNTER — Other Ambulatory Visit: Payer: Self-pay

## 2017-01-19 ENCOUNTER — Telehealth: Payer: Self-pay | Admitting: General Practice

## 2017-01-19 NOTE — Telephone Encounter (Signed)
Patients mother is calling, her daughter had surgery on 01/15/17, she had a appendectomy laparoscopic with Dr.Piscoya, Her mother is calling, said her daughter is having swelling on the right side of her belly button where the appendix was, patient is also having pain, pain level being at a 4. Pain feels like it's getting better, just worried about the swelling. Please call patient and advice.

## 2017-01-20 LAB — CULTURE, BLOOD (ROUTINE X 2)
Culture: NO GROWTH
Culture: NO GROWTH
Special Requests: ADEQUATE

## 2017-01-20 NOTE — Telephone Encounter (Signed)
Call made to patient once again. Bowels are moving very slow. Patient is not taking Miralax as instructed. Pain under control at this time. Patient does have concerns regarding constipation. Informed patient that they should increase water intake and activity level as much as possible to help with bowel movement. Also educated that they may use Miralax over the counter x 2 doses, 6 hours apart, followed by Dulcolax x 2 doses, 6 hours apart until they have a successful bowel movement. Asked patient to call back for further instructions if after taking both doses of these medications, they are still unsuccessful with a bowel movement.  Patient verbalizes understanding of this information.  Denies any other questions or concerns.  Confirmed post-op appointment information. Encouraged patient to call back with any further questions or concerns prior to appointment.

## 2017-01-20 NOTE — Telephone Encounter (Signed)
Spoke with patient's mother at this time. Patient's mother denies drainage and redness at incision sites but does notice some bruising. Denies Fever/Chills, Nausea/ Vomiting, and states that her daughter has said that the pain is getting better each day.  Patient's mother does not know how patient's bowel movements have been and have asked that I call patient at home. Call made to patient. No answer. Left voicemail for return phone call.

## 2017-01-25 ENCOUNTER — Ambulatory Visit (INDEPENDENT_AMBULATORY_CARE_PROVIDER_SITE_OTHER): Payer: BC Managed Care – PPO | Admitting: Surgery

## 2017-01-25 ENCOUNTER — Encounter: Payer: Self-pay | Admitting: Surgery

## 2017-01-25 VITALS — BP 127/82 | HR 103 | Temp 98.1°F | Ht 66.0 in | Wt 205.2 lb

## 2017-01-25 DIAGNOSIS — K3580 Unspecified acute appendicitis: Secondary | ICD-10-CM

## 2017-01-25 NOTE — Progress Notes (Signed)
Outpatient postop visit  01/25/2017  Tammy Chandler is an 21 y.o. female.    Procedure: Laparoscopic appendectomy  CC: Periumbilical discomfort  HPI: This patient underwent a laparoscopic appendectomy. Patient describes periumbilical discomfort it's not causing her much pain she's had no nausea vomiting fevers or chills and is tolerating a regular diet  Medications reviewed.    Physical Exam:  There were no vitals taken for this visit.    PE: Patient appears well Abdomen is soft and completely nontender no guarding rebound or percussion tenderness no erythema no drainage Dermabond is still in place    Assessment/Plan:  Path reviewed showing appendicitis without malignancy. Patient doing very well recommend follow up on an as-needed basis  Tammy Hawichard E Cooper, MD, FACS

## 2017-01-25 NOTE — Patient Instructions (Signed)
Please do not lift anything over 20 pounds for at least several more weeks. If you do lift something and you feel pain then you may want to wait a few more days and try again. Please call our office if you have any questions or concerns.

## 2018-01-11 ENCOUNTER — Ambulatory Visit (INDEPENDENT_AMBULATORY_CARE_PROVIDER_SITE_OTHER): Payer: BC Managed Care – PPO | Admitting: Obstetrics and Gynecology

## 2018-01-11 ENCOUNTER — Encounter: Payer: Self-pay | Admitting: Obstetrics and Gynecology

## 2018-01-11 VITALS — BP 120/80 | Ht 66.0 in | Wt 208.0 lb

## 2018-01-11 DIAGNOSIS — Z30011 Encounter for initial prescription of contraceptive pills: Secondary | ICD-10-CM | POA: Diagnosis not present

## 2018-01-11 DIAGNOSIS — L700 Acne vulgaris: Secondary | ICD-10-CM

## 2018-01-11 MED ORDER — NORETHIN ACE-ETH ESTRAD-FE 1-20 MG-MCG(24) PO TABS: 1.0000 | ORAL_TABLET | Freq: Every day | ORAL | 1 refills | Status: DC

## 2018-01-11 NOTE — Progress Notes (Signed)
[name], MD   Chief Complaint  Patient presents with  . Contraception    HPI:      [name] is a 22 y.o. G0P0000 who LMP was Patient's last menstrual period was 12/26/2017., presents today for NP [facility] conf. Pt would like to start OCPs for acne. She has tried several OTC acne meds without relief. Menses are monthly, last 5 days, no BTB, mild dysmen. She is not sex active but has been in the past. She did depo in the past with wt gain and doesn't want that again. She has a hx of epilepsy and is on zonegran daily.  She is not current on annual/STD testing.    Past Medical History:  Diagnosis Date  . Epilepsy [facility])     Past Surgical History:  Procedure Laterality Date  . APPENDECTOMY    . LAPAROSCOPIC APPENDECTOMY N/A 01/15/2017   Procedure: APPENDECTOMY LAPAROSCOPIC;  Surgeon: Henrene Dodge, MD;  Location: [facility] ORS;  Service: General;  Laterality: N/A;    Family History  Problem Relation Age of Onset  . Deep vein thrombosis Mother   . Diabetes Father   . Heart failure Father   . Renal Disease Father   . Heart failure Maternal Grandmother   . Pancreatic cancer Maternal Grandfather     Social History   Socioeconomic History  . Marital status: Single    Spouse name: Not on file  . Number of children: Not on file  . Years of education: Not on file  . Highest education level: Not on file  Occupational History  . Not on file  Social Needs  . Financial resource strain: Not on file  . Food insecurity:    Worry: Not on file    Inability: Not on file  . Transportation needs:    Medical: Not on file    Non-medical: Not on file  Tobacco Use  . Smoking status: Never Smoker  . Smokeless tobacco: Never Used  Substance and Sexual Activity  . Alcohol use: No  . Drug use: No  . Sexual activity: Not Currently  Lifestyle  . Physical activity:    Days per week: Not on file    Minutes per session: Not on file  . Stress: Not on file  Relationships  .  Social connections:    Talks on phone: Not on file    Gets together: Not on file    Attends religious service: Not on file    Active member of club or organization: Not on file    Attends meetings of clubs or organizations: Not on file    Relationship status: Not on file  . Intimate partner violence:    Fear of current or ex partner: Not on file    Emotionally abused: Not on file    Physically abused: Not on file    Forced sexual activity: Not on file  Other Topics Concern  . Not on file  Social History Narrative  . Not on file    Outpatient Medications Prior to Visit  Medication Sig Dispense Refill  . zonisamide (ZONEGRAN) 100 MG capsule Take 1 capsule by mouth daily.   11  . oxyCODONE (OXY IR/ROXICODONE) 5 MG immediate release tablet Take 1-2 tablets (5-10 mg total) by mouth every 4 (four) hours as needed for moderate pain. (Patient not taking: Reported on 01/11/2018) 30 tablet 0   No facility-administered medications prior to visit.       ROS:  Review of  Systems  Constitutional: Negative for fatigue, fever and unexpected weight change.  Respiratory: Negative for cough, shortness of breath and wheezing.   Cardiovascular: Negative for chest pain, palpitations and leg swelling.  Gastrointestinal: Negative for blood in stool, constipation, diarrhea, nausea and vomiting.  Endocrine: Negative for cold intolerance, heat intolerance and polyuria.  Genitourinary: Negative for dyspareunia, dysuria, flank pain, frequency, genital sores, hematuria, menstrual problem, pelvic pain, urgency, vaginal bleeding, vaginal discharge and vaginal pain.  Musculoskeletal: Negative for back pain, joint swelling and myalgias.  Skin: Negative for rash.  Neurological: Negative for dizziness, syncope, light-headedness, numbness and headaches.  Hematological: Negative for adenopathy.  Psychiatric/Behavioral: Negative for agitation, confusion, sleep disturbance and suicidal ideas. The patient is not  nervous/anxious.    BREAST: No symptoms   OBJECTIVE:   Vitals:  BP 120/80   Ht  (1.676 m)   Wt 208 lb (94.3 kg)   LMP 12/26/2017   BMI 33.57 kg/m   Physical Exam  Constitutional: She is oriented to person, place, and time. She appears well-developed.  Neck: Normal range of motion.  Pulmonary/Chest: Effort normal.  Musculoskeletal: Normal range of motion.  Neurological: She is alert and oriented to person, place, and time. No cranial nerve deficit.  Skin:  PAPULAR LESIONS AROUND MOUTH/CHIN  Psychiatric: She has a normal mood and affect. Her behavior is normal. Judgment and thought content normal.   Assessment/Plan: Encounter for initial prescription of contraceptive pills - OCP start with next menses. Condoms for 1 mo. Rx lomedia. RTO in 2 months for Rx f/u/annual. No interaction with zonegran per epocrates.  - Plan: Norethindrone Acetate-Ethinyl Estrad-FE (MICROGESTIN 24 FE) 1-20 MG-MCG(24) tablet  Acne vulgaris - Try OCPs.     Meds ordered this encounter  Medications  . Norethindrone Acetate-Ethinyl Estrad-FE (MICROGESTIN 24 FE) 1-20 MG-MCG(24) tablet    Sig: Take 1 tablet by mouth daily.    Dispense:  28 tablet    Refill:  1    Order Specific Question:   Supervising Provider    Answer:   Nadara Mustard [[id number]]      Return in about 2 months (around 03/13/2018) for annual.  Balraj Brayfield B. Ardith Test, PA-C 01/11/2018 4:48 PM

## 2018-01-11 NOTE — Patient Instructions (Signed)
I value your feedback and entrusting us with your care. If you get a [facility] patient survey, I would appreciate you taking the time to let us know about your experience today. Thank you! 

## 2018-01-31 ENCOUNTER — Other Ambulatory Visit: Payer: Self-pay | Admitting: Obstetrics and Gynecology

## 2018-01-31 DIAGNOSIS — Z30011 Encounter for initial prescription of contraceptive pills: Secondary | ICD-10-CM

## 2018-03-11 ENCOUNTER — Other Ambulatory Visit: Payer: Self-pay | Admitting: Obstetrics and Gynecology

## 2018-03-11 DIAGNOSIS — Z30011 Encounter for initial prescription of contraceptive pills: Secondary | ICD-10-CM

## 2018-03-13 ENCOUNTER — Encounter: Payer: Self-pay | Admitting: Obstetrics and Gynecology

## 2018-03-13 ENCOUNTER — Other Ambulatory Visit (HOSPITAL_COMMUNITY)
Admission: RE | Admit: 2018-03-13 | Discharge: 2018-03-13 | Disposition: A | Discharge status: Home / Self Care | Payer: BC Managed Care – PPO | Source: Ambulatory Visit | Attending: Obstetrics and Gynecology | Admitting: Obstetrics and Gynecology

## 2018-03-13 ENCOUNTER — Ambulatory Visit (INDEPENDENT_AMBULATORY_CARE_PROVIDER_SITE_OTHER): Payer: BC Managed Care – PPO | Admitting: Obstetrics and Gynecology

## 2018-03-13 VITALS — BP 110/70 | HR 97 | Ht 66.0 in | Wt 212.0 lb

## 2018-03-13 DIAGNOSIS — Z30011 Encounter for initial prescription of contraceptive pills: Secondary | ICD-10-CM | POA: Diagnosis not present

## 2018-03-13 DIAGNOSIS — Z3041 Encounter for surveillance of contraceptive pills: Secondary | ICD-10-CM

## 2018-03-13 DIAGNOSIS — Z01419 Encounter for gynecological examination (general) (routine) without abnormal findings: Secondary | ICD-10-CM

## 2018-03-13 DIAGNOSIS — Z113 Encounter for screening for infections with a predominantly sexual mode of transmission: Secondary | ICD-10-CM | POA: Insufficient documentation

## 2018-03-13 DIAGNOSIS — L700 Acne vulgaris: Secondary | ICD-10-CM

## 2018-03-13 DIAGNOSIS — N760 Acute vaginitis: Secondary | ICD-10-CM | POA: Diagnosis not present

## 2018-03-13 DIAGNOSIS — Z01411 Encounter for gynecological examination (general) (routine) with abnormal findings: Secondary | ICD-10-CM

## 2018-03-13 DIAGNOSIS — N898 Other specified noninflammatory disorders of vagina: Secondary | ICD-10-CM

## 2018-03-13 LAB — POCT WET PREP WITH KOH
CLUE CELLS WET PREP PER HPF POC: NEGATIVE
KOH Prep POC: NEGATIVE
Trichomonas, UA: NEGATIVE
Yeast Wet Prep HPF POC: NEGATIVE

## 2018-03-13 MED ORDER — NORETHIN ACE-ETH ESTRAD-FE 1-20 MG-MCG(24) PO TABS: 1.0000 | ORAL_TABLET | Freq: Every day | ORAL | 3 refills | Status: DC

## 2018-03-13 NOTE — Patient Instructions (Signed)
I value your feedback and entrusting us with your care. If you get a Tammy Chandler patient survey, I would appreciate you taking the time to let us know about your experience today. Thank you! 

## 2018-03-13 NOTE — Progress Notes (Signed)
PCP:  Marguarite ArbourSparks, Jeffrey D, MD   Chief Complaint  Patient presents with  . Gynecologic Exam     HPI:      [name] is a 22 y.o. G0P0000 who LMP was Patient's last menstrual period was 02/23/2018., presents today for her annual examination.  Her menses are regular every 28-30 days, lasting 6 days.  Dysmenorrhea mild, occurring first 1-2 days of flow. She does not have intermenstrual bleeding. Started OCPs 5/19 for acne. No complexion improvement yet. No side effects. Willing to cont meds.   Sex activity: not sexually active, but has been in past. Last Pap: N/A Hx of STDs: none Pt had increased vag d/c, itch/irritation, no fishy odor for a couple wks. Treated with 2 monistat-7 doses and 1 monistat-1 dose with eventual relief. Sx mostly gone now but just finished last tx. No recent abx use.   There is no FH of breast cancer. There is no FH of ovarian cancer. The patient does not do self-breast exams.  Tobacco use: The patient denies current or previous tobacco use. Alcohol use: none No drug use.  Exercise: moderately active  She does get adequate calcium and Vitamin D in her diet. Gardasil not done.   Past Medical History:  Diagnosis Date  . Acne   . Epilepsy [facility](H[facility])     Past Surgical History:  Procedure Laterality Date  . APPENDECTOMY    . LAPAROSCOPIC APPENDECTOMY N/A 01/15/2017   Procedure: APPENDECTOMY LAPAROSCOPIC;  Surgeon: Henrene DodgePiscoya, Jose, MD;  Location: [facility] ORS;  Service: General;  Laterality: N/A;    Family History  Problem Relation Age of Onset  . Deep vein thrombosis Mother   . Diabetes Father   . Heart failure Father   . Renal Disease Father   . Heart failure Maternal Grandmother   . Pancreatic cancer Maternal Grandfather   . Breast cancer Neg Hx   . Ovarian cancer Neg Hx     Social History   Socioeconomic History  . Marital status: Single    Spouse name: Not on file  . Number of children: Not on file  . Years of education: Not on file  .  Highest education level: Not on file  Occupational History  . Not on file  Social Needs  . Financial resource strain: Not on file  . Food insecurity:    Worry: Not on file    Inability: Not on file  . Transportation needs:    Medical: Not on file    Non-medical: Not on file  Tobacco Use  . Smoking status: Never Smoker  . Smokeless tobacco: Never Used  Substance and Sexual Activity  . Alcohol use: No  . Drug use: No  . Sexual activity: Not Currently  Lifestyle  . Physical activity:    Days per week: Not on file    Minutes per session: Not on file  . Stress: Not on file  Relationships  . Social connections:    Talks on phone: Not on file    Gets together: Not on file    Attends religious service: Not on file    Active member of club or organization: Not on file    Attends meetings of clubs or organizations: Not on file    Relationship status: Not on file  . Intimate partner violence:    Fear of current or ex partner: Not on file    Emotionally abused: Not on file    Physically abused: Not on file  Forced sexual activity: Not on file  Other Topics Concern  . Not on file  Social History Narrative  . Not on file    Outpatient Medications Prior to Visit  Medication Sig Dispense Refill  . zonisamide (ZONEGRAN) 100 MG capsule Take 1 capsule by mouth daily.   11  . BLISOVI 24 FE 1-20 MG-MCG(24) tablet TAKE 1 TABLET BY MOUTH EVERY DAY 28 tablet 0  . oxyCODONE (OXY IR/ROXICODONE) 5 MG immediate release tablet Take 1-2 tablets (5-10 mg total) by mouth every 4 (four) hours as needed for moderate pain. (Patient not taking: Reported on 01/11/2018) 30 tablet 0   No facility-administered medications prior to visit.       ROS:  Review of Systems  Constitutional: Negative for fatigue, fever and unexpected weight change.  Respiratory: Negative for cough, shortness of breath and wheezing.   Cardiovascular: Negative for chest pain, palpitations and leg swelling.    Gastrointestinal: Negative for blood in stool, constipation, diarrhea, nausea and vomiting.  Endocrine: Negative for cold intolerance, heat intolerance and polyuria.  Genitourinary: Negative for dyspareunia, dysuria, flank pain, frequency, genital sores, hematuria, menstrual problem, pelvic pain, urgency, vaginal bleeding, vaginal discharge and vaginal pain.  Musculoskeletal: Negative for back pain, joint swelling and myalgias.  Skin: Negative for rash.  Neurological: Negative for dizziness, syncope, light-headedness, numbness and headaches.  Hematological: Negative for adenopathy.  Psychiatric/Behavioral: Negative for agitation, confusion, sleep disturbance and suicidal ideas. The patient is not nervous/anxious.   BREAST: No symptoms   Objective: BP 110/70   Pulse 97   Ht 5\' 6"  (1.676 m)   Wt 212 lb (96.2 kg)   LMP 02/23/2018   BMI 34.22 kg/m    Physical Exam  Constitutional: She is oriented to person, place, and time. She appears well-developed and well-nourished.  Genitourinary: Vagina normal and uterus normal. There is no rash or tenderness on the right labia. There is no rash or tenderness on the left labia. No erythema or tenderness in the vagina. No vaginal discharge found. Right adnexum does not display mass and does not display tenderness. Left adnexum does not display mass and does not display tenderness. Cervix does not exhibit motion tenderness or polyp. Uterus is not enlarged or tender.  Neck: Normal range of motion. No thyromegaly present.  Cardiovascular: Normal rate, regular rhythm and normal heart sounds.  No murmur heard. Pulmonary/Chest: Effort normal and breath sounds normal. Right breast exhibits no mass, no nipple discharge, no skin change and no tenderness. Left breast exhibits no mass, no nipple discharge, no skin change and no tenderness.  Abdominal: Soft. There is no tenderness. There is no guarding.  Musculoskeletal: Normal range of motion.  Neurological:  She is alert and oriented to person, place, and time. No cranial nerve deficit.  Psychiatric: She has a normal mood and affect. Her behavior is normal.  Vitals reviewed.   Results: Results for orders placed or performed in visit on 03/13/18 (from the past 24 hour(s))  POCT Wet Prep with KOH     Status: Normal   Collection Time: 03/13/18  4:48 PM  Result Value Ref Range   Trichomonas, UA Negative    Clue Cells Wet Prep HPF POC neg    Epithelial Wet Prep HPF POC  Few, Moderate, Many, Too numerous to count   Yeast Wet Prep HPF POC neg    Bacteria Wet Prep HPF POC  Few   RBC Wet Prep HPF POC     WBC Wet Prep HPF POC  KOH Prep POC Negative Negative    Assessment/Plan: Encounter for annual routine gynecological examination  Screening for STD (sexually transmitted disease) - Plan: Cervicovaginal ancillary only  Encounter for surveillance of contraceptive pills - OCP RF.  - Plan: Norethindrone Acetate-Ethinyl Estrad-FE (BLISOVI 24 FE) 1-20 MG-MCG(24) tablet  Acne vulgaris - Give OCPs more time to work. Only on 2nd pill pack. F/u prn.  - Plan: Norethindrone Acetate-Ethinyl Estrad-FE (BLISOVI 24 FE) 1-20 MG-MCG(24) tablet  Acute vaginitis - Neg wet prep/exam but pt treated with OTC meds. Will send in Rx terazol if sx recur. F/u prn.  - Plan: POCT Wet Prep with KOH  Encounter for initial prescription of contraceptive pills - OCP start with next menses. Condoms for 1 mo. Rx lomedia. RTO in 2 months for Rx f/u/annual. No interaction with zonegran per epocrates.   Meds ordered this encounter  Medications  . Norethindrone Acetate-Ethinyl Estrad-FE (BLISOVI 24 FE) 1-20 MG-MCG(24) tablet    Sig: Take 1 tablet by mouth daily.    Dispense:  84 tablet    Refill:  3    Order Specific Question:   Supervising Provider    Answer:   Nadara Mustard [[id number]]             GYN counsel use and side effects of OCP's, adequate intake of calcium and vitamin D, diet and exercise     F/U  Return in  about 1 year (around 03/14/2019).  Alicia B. Copland, PA-C 03/13/2018 4:50 PM

## 2018-03-15 LAB — CERVICOVAGINAL ANCILLARY ONLY
Chlamydia: NEGATIVE
Neisseria Gonorrhea: NEGATIVE
Trichomonas: NEGATIVE

## 2019-01-03 IMAGING — CT CT ABD-PELV W/ CM
2 of 4 series · 15 of 46 positions shown, 17 images · IV contrast (APPLIED)
Comparison: None.

CLINICAL DATA: Acute onset of bilateral flank pain. Recently
diagnosed with urinary tract infection. Initial encounter.

EXAM:
CT ABDOMEN AND PELVIS WITH CONTRAST
TECHNIQUE: Multidetector CT imaging of the abdomen and pelvis was performed
using the standard protocol following bolus administration of
intravenous contrast.
CONTRAST:  100mL NPUM0Z-QTT IOPAMIDOL (NPUM0Z-QTT) INJECTION 61%

[Series 2: routine abd/pel with · axial · 0.78mm/px · z∈[-558,-103]mm · 12 of 105 slices shown, 14 images]
[im 9/105  soft-tissue]
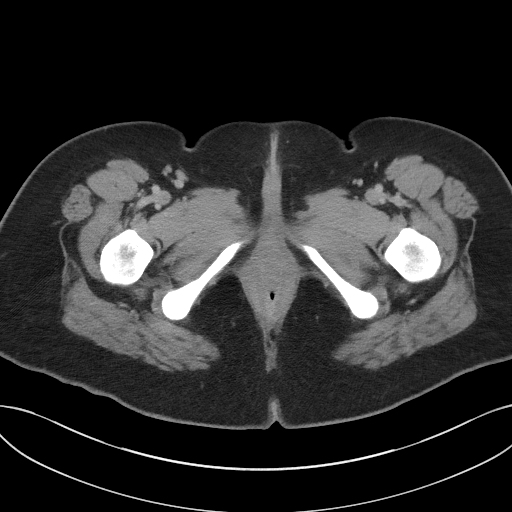
[im 9/105  bone]
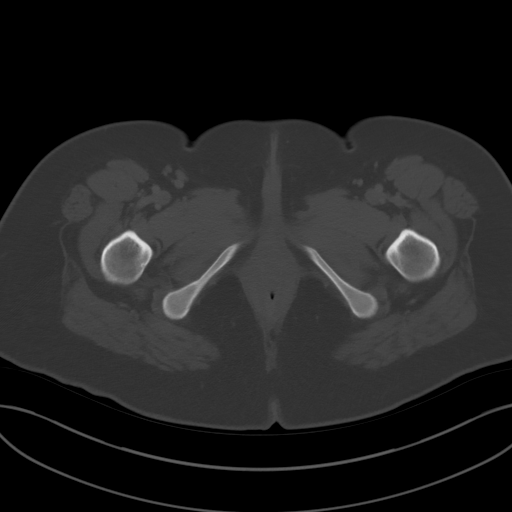
[im 17/105  soft-tissue]
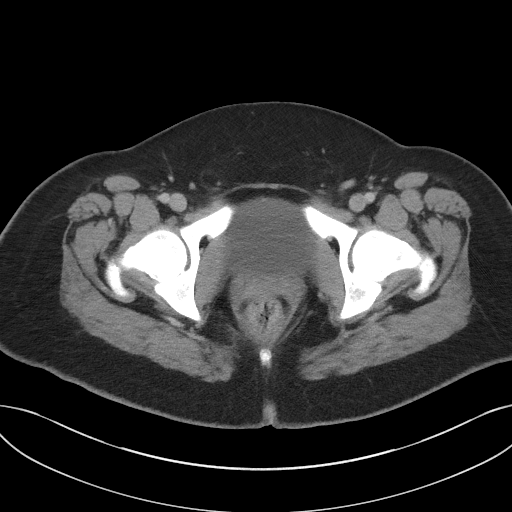
[im 25/105  soft-tissue]
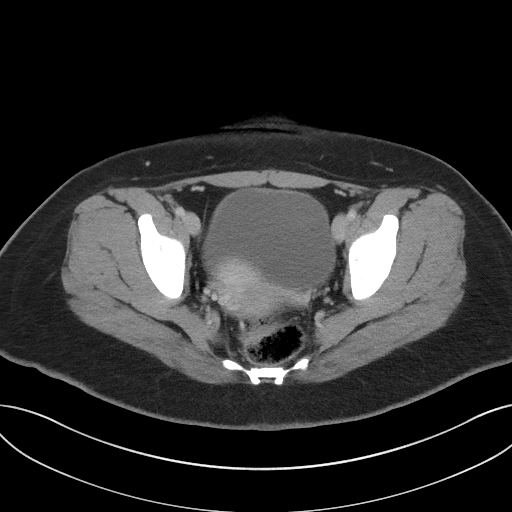
[im 34/105  soft-tissue]
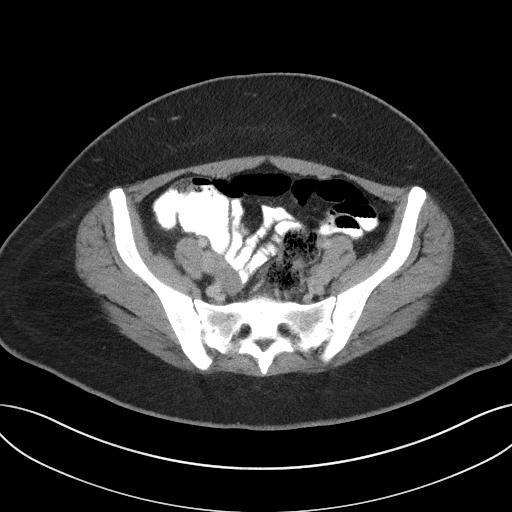
[im 42/105  soft-tissue]
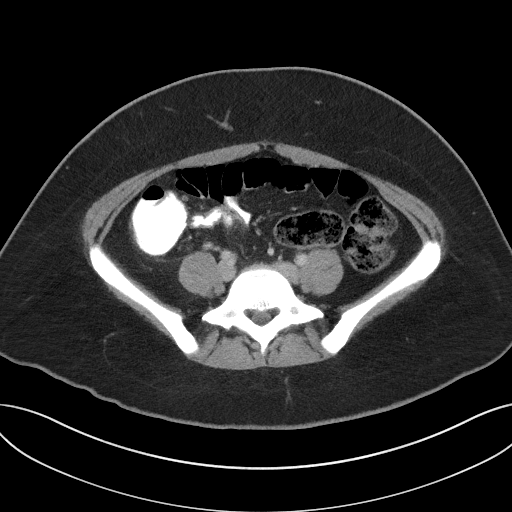
[im 50/105  soft-tissue]
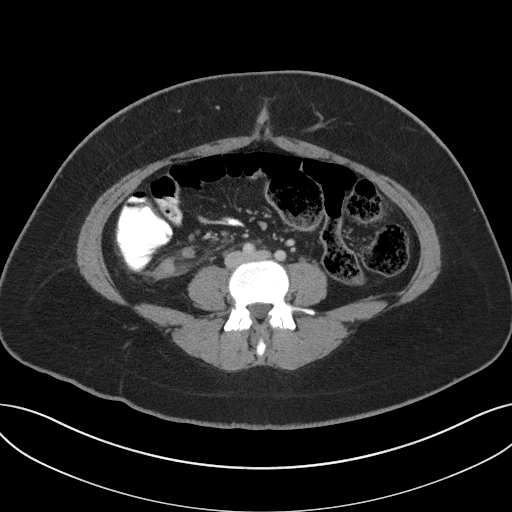
[im 59/105  soft-tissue]
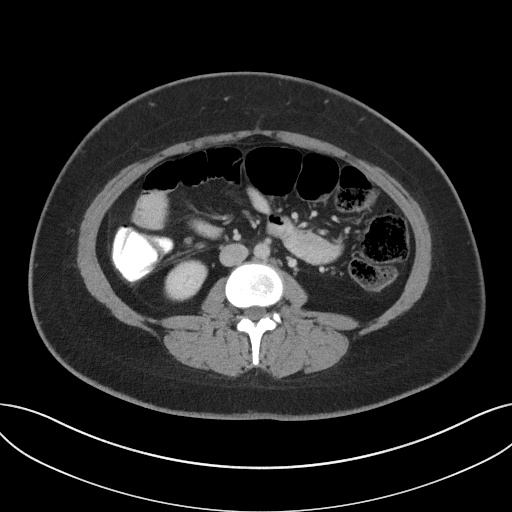
[im 67/105  soft-tissue]
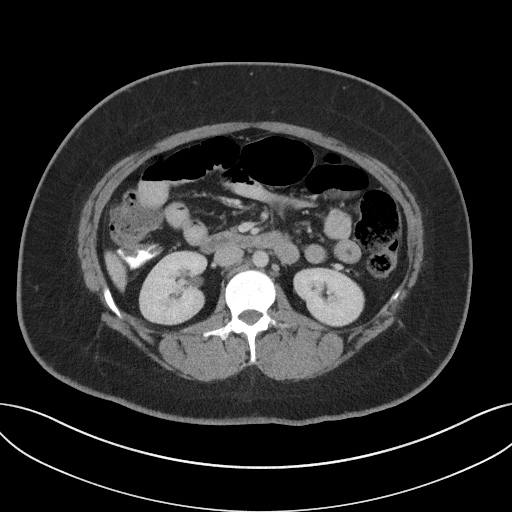
[im 75/105  soft-tissue]
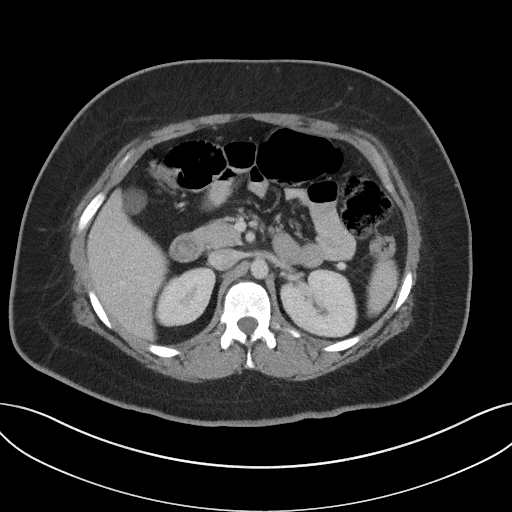
[im 75/105  bone]
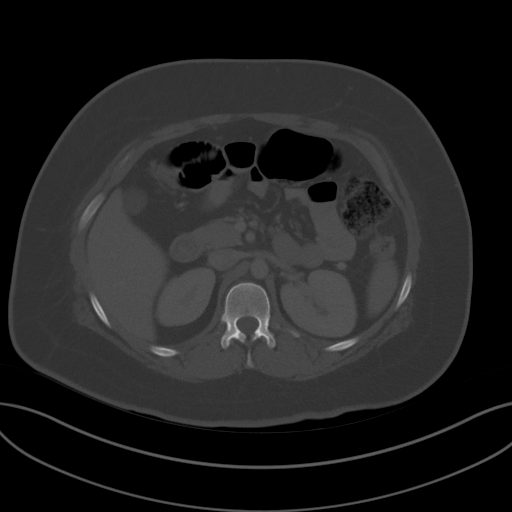
[im 84/105  soft-tissue]
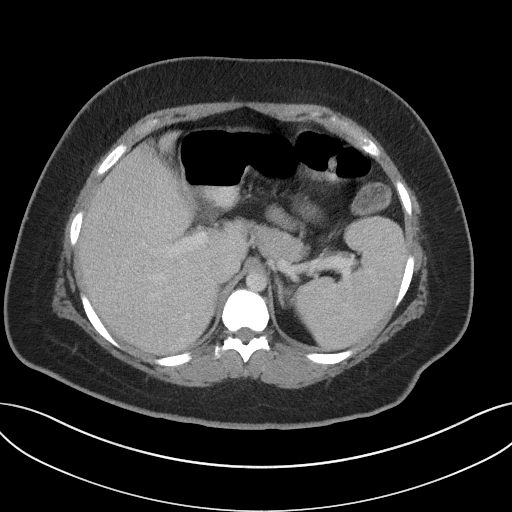
[im 92/105  soft-tissue]
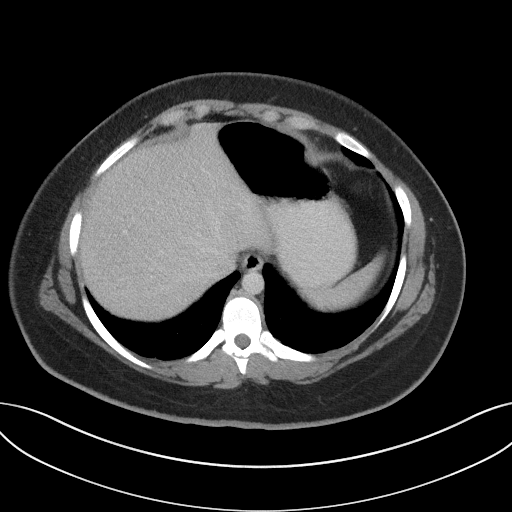
[im 100/105  soft-tissue]
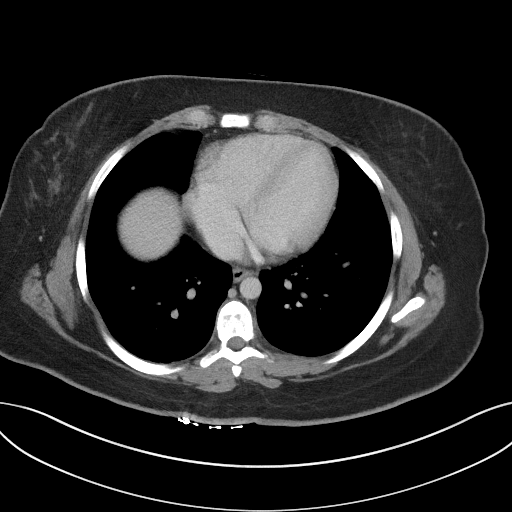

[Series 5: coronal st · coronal · 0.71mm/px · 3 of 97 slices shown]
[im 33/97  soft-tissue]
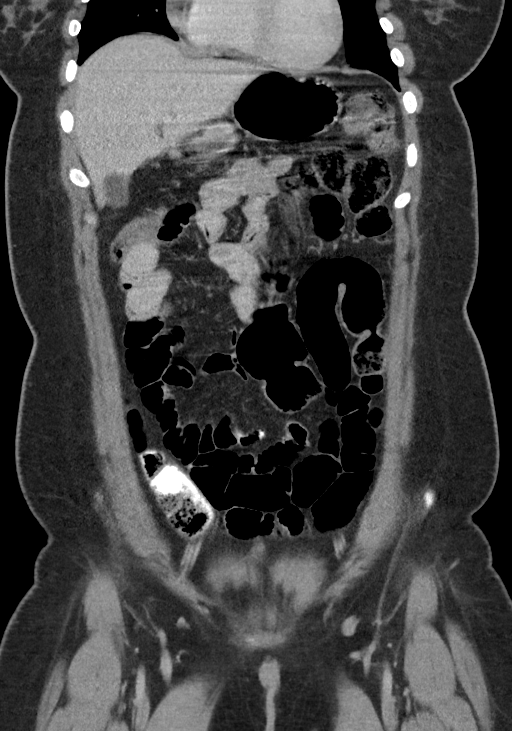
[im 43/97  soft-tissue]
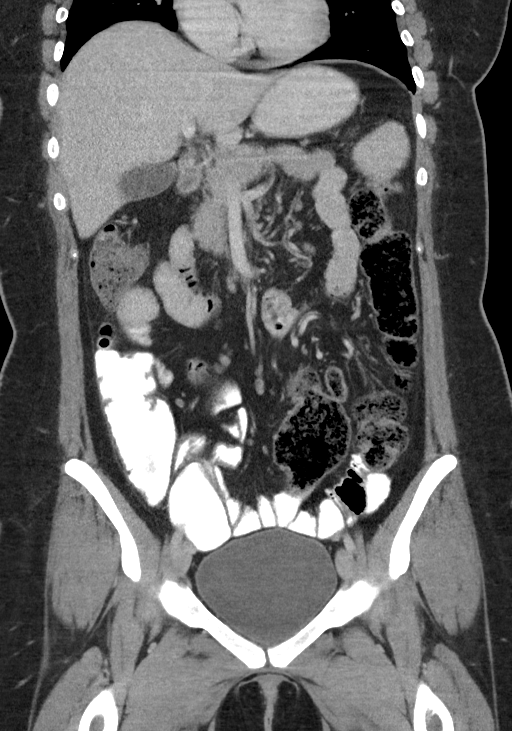
[im 54/97  soft-tissue]
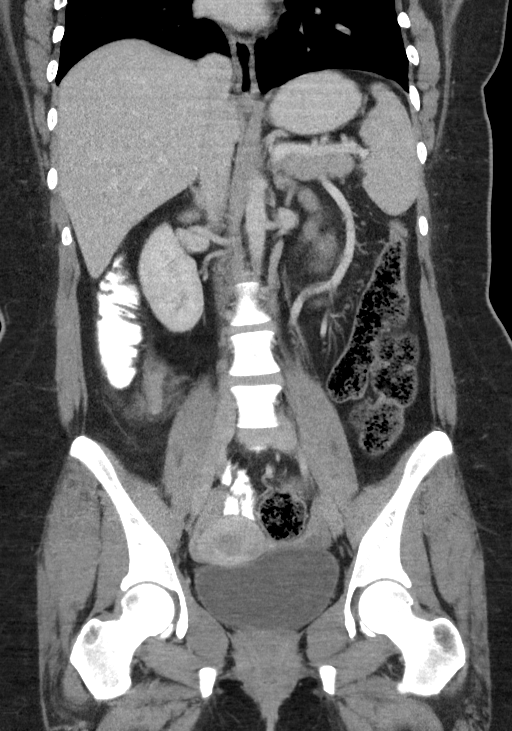

[15 of 46 positions shown; findings below may reference images not displayed]

FINDINGS: Lower chest: Minimal bibasilar atelectasis is noted. The visualized
portions of the mediastinum are unremarkable.

Hepatobiliary: The liver is unremarkable in appearance. The
gallbladder is unremarkable in appearance. The common bile duct
remains normal in caliber.

Pancreas: The pancreas is within normal limits.

Spleen: The spleen is unremarkable in appearance.

Adrenals/Urinary Tract: The adrenal glands are unremarkable in
appearance. The kidneys are within normal limits. There is no
evidence of hydronephrosis. No renal or ureteral stones are
identified. No perinephric stranding is seen.

Stomach/Bowel: There is dilatation of the appendix to 1.3 cm in
diameter, with surrounding trace fluid, compatible with acute
appendicitis. Mildly prominent pericecal nodes are seen. There is no
evidence of perforation or abscess formation at this time. The
appendix is retrocecal in nature.

The colon is grossly unremarkable in appearance. Contrast progresses
to the level of the ascending colon. Visualized small bowel loops
are grossly unremarkable. The stomach is unremarkable in appearance.

Vascular/Lymphatic: The abdominal aorta is unremarkable in
appearance. The inferior vena cava is grossly unremarkable. No
retroperitoneal lymphadenopathy is seen. No pelvic sidewall
lymphadenopathy is identified.

Reproductive: The bladder is moderately distended and grossly
unremarkable. The uterus is unremarkable in appearance. The ovaries
are relatively symmetric. No suspicious adnexal masses are seen.

Other: No additional soft tissue abnormalities are seen.

Musculoskeletal: No acute osseous abnormalities are identified. The
visualized musculature is unremarkable in appearance.
IMPRESSION: Acute appendicitis, with dilatation of the appendix to 1.3 cm in
diameter, and surrounding trace fluid. Mildly prominent pericecal
nodes noted. No evidence of perforation or abscess formation at this
time. The appendix is retrocecal in nature.

These results were called by telephone at the time of interpretation
on 01/15/2017 at [DATE] to Dr. HAO CICCONE, who verbally
acknowledged these results.

## 2019-02-13 ENCOUNTER — Other Ambulatory Visit: Payer: Self-pay | Admitting: Obstetrics and Gynecology

## 2019-02-13 DIAGNOSIS — L700 Acne vulgaris: Secondary | ICD-10-CM

## 2019-02-13 DIAGNOSIS — Z3041 Encounter for surveillance of contraceptive pills: Secondary | ICD-10-CM

## 2019-02-14 ENCOUNTER — Telehealth: Payer: BC Managed Care – PPO | Admitting: Family

## 2019-02-14 DIAGNOSIS — L237 Allergic contact dermatitis due to plants, except food: Secondary | ICD-10-CM

## 2019-02-15 MED ORDER — PREDNISONE 10 MG (21) PO TBPK: ORAL_TABLET | ORAL | 0 refills | Status: DC

## 2019-02-15 NOTE — Progress Notes (Signed)
Greater than 5 minutes, yet less than 10 minutes of time have been spent researching, coordinating, and implementing care for this patient today.  Thank you for the details you included in the comment boxes. Those details are very helpful in determining the best course of treatment for you and help us to provide the best care.  We are sorry that you are not feeing well.  Here is how we plan to help!  Based on what you have shared with me it looks like you have had an allergic reaction to the oily resin from a group of plants.  This resin is very sticky, so it easily attaches to your skin, clothing, tools equipment, and pet's fur.    This blistering rash is often called poison ivy rash although it can come from contact with the leaves, stems and roots of poison ivy, poison oak and poison sumac.  The oily resin contains urushiol (u-ROO-she-ol) that produces a skin rash on exposed skin.  The severity of the rash depends on the amount of urushiol that gets on your skin.  A section of skin with more urushiol on it may develop a rash sooner.  The rash usually develops 12-48 hours after exposure and can last two to three weeks.  Your skin must come in direct contact with the plant's oil to be affected.  Blister fluid doesn't spread the rash.  However, if you come into contact with a piece of clothing or pet fur that has urushiol on it, the rash may spread out.  You can also transfer the oil to other parts of your body with your fingers.  Often the rash looks like a straight line because of the way the plant brushes against your skin.    I have developed the following plan to treat your condition Since your rash is widespread or has resulted in a large number of blisters, I have prescribed an oral corticosteroid.  Please follow these recommendations:  I have sent a prednisone dose pack to your chosen pharmacy. Be sure to follow the instructions carefully and complete the entire prescription. You may use  Benadryl or Caladryl topical lotions to sooth the itch and remember cool, not hot, showers and baths can help relieve the itching!  Place cool, wet compresses on the affected area for 15-30 minutes several times a day.  You may also take oral antihistamines, such as diphenhydramine (Benadryl, others), which may also help you sleep better.  Watch your skin for any purulent (pus) drainage or red streaking from the site.  If this occurs, contact your provider.  You may require an antibiotic for a skin infection.  Make sure that the clothes you were wearing as well as any towels or sheets that may have come in contact with the oil (urushiol) are washed in detergent and hot water.         What can you do to prevent this rash?  Avoid the plants.  Learn how to identify poison ivy, poison oak and poison sumac in all seasons.  When hiking or engaging in other activities that might expose you to these plants, try to stay on cleared pathways.  If camping, make sure you pitch your tent in an area free of these plants.  Keep pets from running through wooded areas so that urushiol doesn't accidentally stick to their fur, which you may touch.  Remove or kill the plants.  In your yard, you can get rid of poison ivy by applying an herbicide   or pulling it out of the ground, including the roots, while wearing heavy gloves.  Afterward remove the gloves and thoroughly wash them and your hands.  Don't burn poison ivy or related plants because the urushiol can be carried by smoke.  Wear protective clothing.  If needed, protect your skin by wearing socks, boots, pants, long sleeves and vinyl gloves.  Wash your skin right away.  Washing off the oil with soap and water within 30 minutes of exposure may reduce your chances of getting a poison ivy rash.  Even washing after an hour or so can help reduce the severity of the rash.  If you walk through some poison ivy and then later touch your shoes, you may get some urushiol on your  hands, which may then transfer to your face or body by touching or rubbing.  If the contaminated object isn't cleaned, the urushiol on it can still cause a skin reaction years later.    Be careful not to reuse towels after you have washed your skin.  Also carefully wash clothing in detergent and hot water to remove all traces of the oil.  Handle contaminated clothing carefully so you don't transfer the urushiol to yourself, furniture, rugs or appliances.  Remember that pets can carry the oil on their fur and paws.  If you think your pet may be contaminated with urushiol, put on some long rubber gloves and give your pet a bath.  Finally, be careful not to burn these plants as the smoke can contain traces of the oil.  Inhaling the smoke may result in difficulty breathing. If that occurred you should see a physician as soon as possible.  See your doctor right away if:   The reaction is severe or widespread  You inhaled the smoke from burning poison ivy and are having difficulty breathing  Your skin continues to swell  The rash affects your eyes, mouth or genitals  Blisters are oozing pus  You develop a fever greater than 100 F (37.8 C)  The rash doesn't get better within a few weeks.  If you scratch the poison ivy rash, bacteria under your fingernails may cause the skin to become infected.  See your doctor if pus starts oozing from the blisters.  Treatment generally includes antibiotics.  Poison ivy treatments are usually limited to self-care methods.  And the rash typically goes away on its own in two to three weeks.     If the rash is widespread or results in a large number of blisters, your doctor may prescribe an oral corticosteroid, such as prednisone.  If a bacterial infection has developed at the rash site, your doctor may give you a prescription for an oral antibiotic.  MAKE SURE YOU   Understand these instructions.  Will watch your condition.  Will get help right away if  you are not doing well or get worse.  Thank you for choosing an e-visit. Your e-visit answers were reviewed by a board certified advanced clinical practitioner to complete your personal care plan. Depending upon the condition, your plan could have included both over the counter or prescription medications.  Please review your pharmacy choice. If there is a problem you may use MyChart messaging to have the prescription routed to another pharmacy.   Your safety is important to us. If you have drug allergies check your prescription carefully.  You can use MyChart to ask questions about today's visit, request a non-urgent call back, or ask for a work or   school excuse for 24 hours related to this e-Visit. If it has been greater than 24 hours you will need to follow up with your provider, or enter a new e-Visit to address those concerns.   You will get an email in the next two days asking about your experience. I hope that your e-visit has been valuable and will speed your recovery Thank you for choosing an e-visit.      

## 2019-02-25 ENCOUNTER — Telehealth: Payer: Self-pay

## 2019-02-25 NOTE — Telephone Encounter (Signed)
Pt calling for refill of bc; aware per MyChart she needs appt; is not comfortable coming in d/t [place].  [phone]

## 2019-02-26 ENCOUNTER — Other Ambulatory Visit: Payer: Self-pay | Admitting: Obstetrics and Gynecology

## 2019-02-26 DIAGNOSIS — L700 Acne vulgaris: Secondary | ICD-10-CM

## 2019-02-26 DIAGNOSIS — Z3041 Encounter for surveillance of contraceptive pills: Secondary | ICD-10-CM

## 2019-02-26 MED ORDER — NORETHIN ACE-ETH ESTRAD-FE 1-20 MG-MCG(24) PO TABS: 1.0000 | ORAL_TABLET | Freq: Every day | ORAL | 0 refills | Status: DC

## 2019-02-26 NOTE — Telephone Encounter (Signed)
RF? Telephone visit? Please advise.

## 2019-02-26 NOTE — Telephone Encounter (Signed)
Rx eRxd.  

## 2019-02-26 NOTE — Progress Notes (Signed)
Rx OCP RF till annual.

## 2019-05-13 ENCOUNTER — Other Ambulatory Visit: Payer: Self-pay | Admitting: Obstetrics and Gynecology

## 2019-05-13 DIAGNOSIS — Z3041 Encounter for surveillance of contraceptive pills: Secondary | ICD-10-CM

## 2019-05-13 DIAGNOSIS — L700 Acne vulgaris: Secondary | ICD-10-CM

## 2021-02-04 ENCOUNTER — Other Ambulatory Visit (HOSPITAL_COMMUNITY)
Admission: RE | Admit: 2021-02-04 | Discharge: 2021-02-04 | Disposition: A | Discharge status: Home / Self Care | Payer: BC Managed Care – PPO | Source: Ambulatory Visit | Attending: Obstetrics and Gynecology | Admitting: Obstetrics and Gynecology

## 2021-02-04 ENCOUNTER — Other Ambulatory Visit: Payer: Self-pay

## 2021-02-04 ENCOUNTER — Ambulatory Visit (INDEPENDENT_AMBULATORY_CARE_PROVIDER_SITE_OTHER): Payer: BC Managed Care – PPO | Admitting: Obstetrics and Gynecology

## 2021-02-04 ENCOUNTER — Encounter: Payer: Self-pay | Admitting: Obstetrics and Gynecology

## 2021-02-04 VITALS — BP 110/66 | Ht 66.0 in | Wt 232.0 lb

## 2021-02-04 DIAGNOSIS — Z113 Encounter for screening for infections with a predominantly sexual mode of transmission: Secondary | ICD-10-CM | POA: Diagnosis not present

## 2021-02-04 DIAGNOSIS — Z30017 Encounter for initial prescription of implantable subdermal contraceptive: Secondary | ICD-10-CM

## 2021-02-04 DIAGNOSIS — Z124 Encounter for screening for malignant neoplasm of cervix: Secondary | ICD-10-CM | POA: Diagnosis present

## 2021-02-04 DIAGNOSIS — Z01419 Encounter for gynecological examination (general) (routine) without abnormal findings: Secondary | ICD-10-CM

## 2021-02-04 NOTE — Progress Notes (Signed)
PCP:  [name], MD   Chief Complaint  Patient presents with  . Gynecologic Exam  . Contraception    Would like to change birth control method, unsure to what     HPI:      Tammy Chandler is a 25 y.o. G0P0000 who LMP was Patient's last menstrual period was 01/15/2021 (exact date)., presents today for her annual examination.  Her menses are regular every 28-30 days, lasting 5-6 days.  Dysmenorrhea mild, occurring first 1-2 days of flow. She does not have intermenstrual bleeding. Started OCPs 5/19 for acne. No complexion improvement yet.   Sex activity: currently sexually active--contraception OCPs. Stopped mid pill pack a few days ago. No sex activity since stopping pills. Would like non-daily BC. Was Rxd nuvaring online but never started it. No hx of HTN, DVTs, migraines with aura. Last Pap: N/A in past due to age Hx of STDs: none  There is no FH of breast cancer. There is no FH of ovarian cancer. The patient does not do self-breast exams.  Tobacco use: vapes daily Alcohol use: none No drug use.  Exercise: moderately active  She does get adequate calcium but not Vitamin D in her diet.   Past Medical History:  Diagnosis Date  . Acne   . Epilepsy [facility])     Past Surgical History:  Procedure Laterality Date  . APPENDECTOMY    . LAPAROSCOPIC APPENDECTOMY N/A 01/15/2017   Procedure: APPENDECTOMY LAPAROSCOPIC;  Surgeon: Henrene Dodge, MD;  Location: [facility] ORS;  Service: General;  Laterality: N/A;    Family History  Problem Relation Age of Onset  . Deep vein thrombosis Mother   . Diabetes Father   . Heart failure Father   . Renal Disease Father   . Heart failure Maternal Grandmother   . Pancreatic cancer Maternal Grandfather   . Breast cancer Neg Hx   . Ovarian cancer Neg Hx     Social History   Socioeconomic History  . Marital status: Single    Spouse name: Not on file  . Number of children: Not on file  . Years of education: Not on file  .  Highest education level: Not on file  Occupational History  . Not on file  Tobacco Use  . Smoking status: Never Smoker  . Smokeless tobacco: Never Used  Vaping Use  . Vaping Use: Former  Substance and Sexual Activity  . Alcohol use: No  . Drug use: No  . Sexual activity: Yes    Birth control/protection: Pill  Other Topics Concern  . Not on file  Social History Narrative  . Not on file   Social Determinants of Health   Financial Resource Strain: Not on file  Food Insecurity: Not on file  Transportation Needs: Not on file  Physical Activity: Not on file  Stress: Not on file  Social Connections: Not on file  Intimate Partner Violence: Not on file    Outpatient Medications Prior to Visit  Medication Sig Dispense Refill  . busPIRone (BUSPAR) 7.5 MG tablet     . escitalopram (LEXAPRO) 10 MG tablet Take by mouth.    . levothyroxine (SYNTHROID) 50 MCG tablet Take by mouth.    . phentermine (ADIPEX-P) 37.5 MG tablet Take by mouth.    . zonisamide (ZONEGRAN) 100 MG capsule Take 1 capsule by mouth daily.   11  . zonisamide (ZONEGRAN) 50 MG capsule PLEASE SEE ATTACHED FOR DETAILED DIRECTIONS    . JUNEL FE 1/20 1-20 MG-MCG  tablet Take 1 tablet by mouth daily.    Marland Kitchen etonogestrel-ethinyl estradiol (NUVARING) 0.12-0.015 MG/24HR vaginal ring Place vaginally. (Patient not taking: Reported on 02/04/2021)    . Norethindrone Acetate-Ethinyl Estrad-FE (BLISOVI 24 FE) 1-20 MG-MCG(24) tablet Take 1 tablet by mouth daily. 84 tablet 0  . oxyCODONE (OXY IR/ROXICODONE) 5 MG immediate release tablet Take 1-2 tablets (5-10 mg total) by mouth every 4 (four) hours as needed for moderate pain. (Patient not taking: Reported on 01/11/2018) 30 tablet 0  . predniSONE (STERAPRED UNI-PAK 21 TAB) 10 MG (21) TBPK tablet As directed 21 tablet 0   No facility-administered medications prior to visit.      ROS:  Review of Systems  Constitutional: Negative for fatigue, fever and unexpected weight change.   Respiratory: Negative for cough, shortness of breath and wheezing.   Cardiovascular: Negative for chest pain, palpitations and leg swelling.  Gastrointestinal: Negative for blood in stool, constipation, diarrhea, nausea and vomiting.  Endocrine: Negative for cold intolerance, heat intolerance and polyuria.  Genitourinary: Negative for dyspareunia, dysuria, flank pain, frequency, genital sores, hematuria, menstrual problem, pelvic pain, urgency, vaginal bleeding, vaginal discharge and vaginal pain.  Musculoskeletal: Negative for back pain, joint swelling and myalgias.  Skin: Negative for rash.  Neurological: Negative for dizziness, syncope, light-headedness, numbness and headaches.  Hematological: Negative for adenopathy.  Psychiatric/Behavioral: Negative for agitation, confusion, sleep disturbance and suicidal ideas. The patient is not nervous/anxious.   BREAST: No symptoms   Objective: BP 110/66   Ht 5\' 6"  (1.676 m)   Wt 232 lb (105.2 kg)   LMP 01/15/2021 (Exact Date)   BMI 37.45 kg/m    Physical Exam Constitutional:      Appearance: She is well-developed.  Genitourinary:     Vulva normal.     Right Labia: No rash, tenderness or lesions.    Left Labia: No tenderness, lesions or rash.    No vaginal discharge, erythema or tenderness.      Right Adnexa: not tender and no mass present.    Left Adnexa: not tender and no mass present.    No cervical motion tenderness, friability or polyp.     Uterus is not enlarged or tender.  Breasts:     Right: No mass, nipple discharge, skin change or tenderness.     Left: No mass, nipple discharge, skin change or tenderness.    Neck:     Thyroid: No thyromegaly.  Cardiovascular:     Rate and Rhythm: Normal rate and regular rhythm.     Heart sounds: Normal heart sounds. No murmur heard.   Pulmonary:     Effort: Pulmonary effort is normal.     Breath sounds: Normal breath sounds.  Abdominal:     Palpations: Abdomen is soft.      Tenderness: There is no abdominal tenderness. There is no guarding or rebound.  Musculoskeletal:        General: Normal range of motion.     Cervical back: Normal range of motion.  Lymphadenopathy:     Cervical: No cervical adenopathy.  Neurological:     General: No focal deficit present.     Mental Status: She is alert and oriented to person, place, and time.     Cranial Nerves: No cranial nerve deficit.  Skin:    General: Skin is warm and dry.  Psychiatric:        Mood and Affect: Mood normal.        Behavior: Behavior normal.  Thought Content: Thought content normal.        Judgment: Judgment normal.  Vitals reviewed.    Nexplanon Insertion  Patient given informed consent, signed copy in the chart, time out was performed.  Appropriate time out taken.  Patient's LEFT arm was prepped and draped in the usual sterile fashion. The ruler used to measure and mark insertion area.  Pt was prepped with betadine swab and then injected with 1.0 cc of 2% lidocaine with epinephrine. Nexplanon removed form packaging,  Device confirmed in needle, then inserted full length of needle and withdrawn per handbook instructions.  Pt insertion site covered with steri-strip and a bandage.   Minimal blood loss.  Pt tolerated the procedure welL.  Assessment: Encounter for annual routine gynecological examination  Cervical cancer screening - Plan: Cytology - PAP  Screening for STD (sexually transmitted disease) - Plan: Cytology - PAP  Nexplanon insertion--BC options discussed, pt would like nexplanon. Has not been sex active since stopping pills. Nexplanon inserted today. She was told to remove the dressing in 12-24 hours, to keep the incision area dry for 24 hours and to remove the Steristrip in 2-3  days.  Notify us if any signs of tenderness, redness, pain, or fevers develop.   Meds ordered this encounter  Medications  . etonogestrel (NEXPLANON) 68 MG IMPL implant    Sig: 1 each (68 mg total)  by Subdermal route once for 1 dose.    Dispense:  1 each    Refill:  0    Order Specific Question:   Supervising Provider    Answer:   Nadara Mustard [[id number]]              GYN counsel adequate intake of calcium and vitamin D, diet and exercise     F/U  Return in about 1 year (around 02/04/2022).  Naven Giambalvo B. Rayman Petrosian, PA-C 02/05/2021 10:26 AM

## 2021-02-04 NOTE — Patient Instructions (Signed)
I value your feedback and you entrusting us with your care. If you get a [facility] patient survey, I would appreciate you taking the time to let us know about your experience today. Thank you! ? ? ?

## 2021-02-05 ENCOUNTER — Encounter: Payer: Self-pay | Admitting: Obstetrics and Gynecology

## 2021-02-05 MED ORDER — ETONOGESTREL 68 MG ~~LOC~~ IMPL: 1.0000 | DRUG_IMPLANT | Freq: Once | SUBCUTANEOUS | 0 refills | Status: AC

## 2021-02-08 LAB — CYTOLOGY - PAP
Adequacy: ABSENT
Chlamydia: NEGATIVE
Comment: NEGATIVE
Comment: NORMAL
Diagnosis: NEGATIVE
Neisseria Gonorrhea: NEGATIVE

## 2021-03-10 ENCOUNTER — Encounter: Payer: Self-pay | Admitting: Obstetrics and Gynecology

## 2021-04-10 ENCOUNTER — Emergency Department
Admission: EM | Admit: 2021-04-10 | Discharge: 2021-04-11 | Disposition: A | Discharge status: Home / Self Care | Payer: BC Managed Care – PPO | Attending: Emergency Medicine | Admitting: Emergency Medicine

## 2021-04-10 ENCOUNTER — Other Ambulatory Visit: Payer: Self-pay

## 2021-04-10 DIAGNOSIS — Z5321 Procedure and treatment not carried out due to patient leaving prior to being seen by health care provider: Secondary | ICD-10-CM | POA: Insufficient documentation

## 2021-04-10 DIAGNOSIS — R569 Unspecified convulsions: Secondary | ICD-10-CM | POA: Diagnosis present

## 2021-04-10 LAB — CBC WITH DIFFERENTIAL/PLATELET
Abs Immature Granulocytes: 0.06 10*3/uL (ref 0.00–0.07)
Basophils Absolute: 0 10*3/uL (ref 0.0–0.1)
Basophils Relative: 0 %
Eosinophils Absolute: 0.1 10*3/uL (ref 0.0–0.5)
Eosinophils Relative: 1 %
HCT: 41 % (ref 36.0–46.0)
Hemoglobin: 13.8 g/dL (ref 12.0–15.0)
Immature Granulocytes: 1 %
Lymphocytes Relative: 30 %
Lymphs Abs: 2.7 10*3/uL (ref 0.7–4.0)
MCH: 29.4 pg (ref 26.0–34.0)
MCHC: 33.7 g/dL (ref 30.0–36.0)
MCV: 87.2 fL (ref 80.0–100.0)
Monocytes Absolute: 0.7 10*3/uL (ref 0.1–1.0)
Monocytes Relative: 8 %
Neutro Abs: 5.5 10*3/uL (ref 1.7–7.7)
Neutrophils Relative %: 60 %
Platelets: 241 10*3/uL (ref 150–400)
RBC: 4.7 MIL/uL (ref 3.87–5.11)
RDW: 12 % (ref 11.5–15.5)
WBC: 9.1 10*3/uL (ref 4.0–10.5)
nRBC: 0 % (ref 0.0–0.2)

## 2021-04-10 NOTE — ED Triage Notes (Signed)
Pt. States she may have had a seizure 3 hours pta, she describes inability to open her eyes and unable to move her body like she wanted to. Pt. Was laying when she had seizure like activity, denies trauma or LOC. Pt. States hand movements have continued after episode this afternoon. Pt. States she has not taken seizure meds in a week or two. In triage, hand movements are intermittent.

## 2021-04-11 LAB — BASIC METABOLIC PANEL
Anion gap: 5 (ref 5–15)
BUN: 10 mg/dL (ref 6–20)
CO2: 21 mmol/L — ABNORMAL LOW (ref 22–32)
Calcium: 8.8 mg/dL — ABNORMAL LOW (ref 8.9–10.3)
Chloride: 109 mmol/L (ref 98–111)
Creatinine, Ser: 0.77 mg/dL (ref 0.44–1.00)
GFR, Estimated: >60 mL/min (ref >60)
Glucose, Bld: 106 mg/dL — ABNORMAL HIGH (ref 70–99)
Potassium: 3.8 mmol/L (ref 3.5–5.1)
Sodium: 135 mmol/L (ref 135–145)

## 2021-04-11 NOTE — ED Triage Notes (Signed)
Pt called from [facility] to treatment room, no response 

## 2021-04-11 NOTE — ED Triage Notes (Signed)
Pt called from [facility] x's 3 to treatment room, no response

## 2022-12-20 ENCOUNTER — Ambulatory Visit: Payer: Self-pay | Admitting: Obstetrics and Gynecology

## 2023-01-31 NOTE — Progress Notes (Unsigned)
PCP:  [name], MD   No chief complaint on file.    HPI:      [name] is a 27 y.o. G0P0000 who LMP was No LMP recorded., presents today for her annual examination.  Her menses are regular every 28-30 days, lasting 5-6 days.  Dysmenorrhea mild, occurring first 1-2 days of flow. She does not have intermenstrual bleeding. Started OCPs 5/19 for acne. No complexion improvement yet.   Sex activity: currently sexually active--contraception OCPs. Stopped mid pill pack a few days ago. No sex activity since stopping pills. Would like non-daily BC. Was Rxd nuvaring online but never started it. No hx of HTN, DVTs, migraines with aura. Last Pap: 02/04/21 Results were no abnormalities Hx of STDs: none  There is no FH of breast cancer. There is no FH of ovarian cancer. The patient does not do self-breast exams.  Tobacco use: vapes daily Alcohol use: none No drug use.  Exercise: moderately active  She does get adequate calcium but not Vitamin D in her diet.   Past Medical History:  Diagnosis Date   Acne    Epilepsy (HCC)     Past Surgical History:  Procedure Laterality Date   APPENDECTOMY     LAPAROSCOPIC APPENDECTOMY N/A 01/15/2017   Procedure: APPENDECTOMY LAPAROSCOPIC;  Surgeon: Henrene Dodge, MD;  Location: [facility] ORS;  Service: General;  Laterality: N/A;    Family History  Problem Relation Age of Onset   Deep vein thrombosis Mother    Diabetes Father    Heart failure Father    Renal Disease Father    Heart failure Maternal Grandmother    Pancreatic cancer Maternal Grandfather    Breast cancer Neg Hx    Ovarian cancer Neg Hx     Social History   Socioeconomic History   Marital status: Single    Spouse name: Not on file   Number of children: Not on file   Years of education: Not on file   Highest education level: Not on file  Occupational History   Not on file  Tobacco Use   Smoking status: Never   Smokeless tobacco: Never  Vaping Use   Vaping  Use: Former  Substance and Sexual Activity   Alcohol use: No   Drug use: No   Sexual activity: Yes    Birth control/protection: Pill  Other Topics Concern   Not on file  Social History Narrative   Not on file   Social Determinants of Health   Financial Resource Strain: Not on file  Food Insecurity: Not on file  Transportation Needs: Not on file  Physical Activity: Not on file  Stress: Not on file  Social Connections: Not on file  Intimate Partner Violence: Not on file    Outpatient Medications Prior to Visit  Medication Sig Dispense Refill   busPIRone (BUSPAR) 7.5 MG tablet      escitalopram (LEXAPRO) 10 MG tablet Take by mouth.     etonogestrel (NEXPLANON) 68 MG IMPL implant 1 each (68 mg total) by Subdermal route once for 1 dose. 1 each 0   levothyroxine (SYNTHROID) 50 MCG tablet Take by mouth.     phentermine (ADIPEX-P) 37.5 MG tablet Take by mouth.     zonisamide (ZONEGRAN) 100 MG capsule Take 1 capsule by mouth daily.   11   zonisamide (ZONEGRAN) 50 MG capsule PLEASE SEE ATTACHED FOR DETAILED DIRECTIONS     No facility-administered medications prior to visit.      ROS:  Review of Systems  Constitutional:  Negative for fatigue, fever and unexpected weight change.  Respiratory:  Negative for cough, shortness of breath and wheezing.   Cardiovascular:  Negative for chest pain, palpitations and leg swelling.  Gastrointestinal:  Negative for blood in stool, constipation, diarrhea, nausea and vomiting.  Endocrine: Negative for cold intolerance, heat intolerance and polyuria.  Genitourinary:  Negative for dyspareunia, dysuria, flank pain, frequency, genital sores, hematuria, menstrual problem, pelvic pain, urgency, vaginal bleeding, vaginal discharge and vaginal pain.  Musculoskeletal:  Negative for back pain, joint swelling and myalgias.  Skin:  Negative for rash.  Neurological:  Negative for dizziness, syncope, light-headedness, numbness and headaches.  Hematological:   Negative for adenopathy.  Psychiatric/Behavioral:  Negative for agitation, confusion, sleep disturbance and suicidal ideas. The patient is not nervous/anxious.   BREAST: No symptoms   Objective: There were no vitals taken for this visit.   Physical Exam Constitutional:      Appearance: She is well-developed.  Genitourinary:     Vulva normal.     Right Labia: No rash, tenderness or lesions.    Left Labia: No tenderness, lesions or rash.    No vaginal discharge, erythema or tenderness.      Right Adnexa: not tender and no mass present.    Left Adnexa: not tender and no mass present.    No cervical motion tenderness, friability or polyp.     Uterus is not enlarged or tender.  Breasts:    Right: No mass, nipple discharge, skin change or tenderness.     Left: No mass, nipple discharge, skin change or tenderness.  Neck:     Thyroid: No thyromegaly.  Cardiovascular:     Rate and Rhythm: Normal rate and regular rhythm.     Heart sounds: Normal heart sounds. No murmur heard. Pulmonary:     Effort: Pulmonary effort is normal.     Breath sounds: Normal breath sounds.  Abdominal:     Palpations: Abdomen is soft.     Tenderness: There is no abdominal tenderness. There is no guarding or rebound.  Musculoskeletal:        General: Normal range of motion.     Cervical back: Normal range of motion.  Lymphadenopathy:     Cervical: No cervical adenopathy.  Neurological:     General: No focal deficit present.     Mental Status: She is alert and oriented to person, place, and time.     Cranial Nerves: No cranial nerve deficit.  Skin:    General: Skin is warm and dry.  Psychiatric:        Mood and Affect: Mood normal.        Behavior: Behavior normal.        Thought Content: Thought content normal.        Judgment: Judgment normal.  Vitals reviewed.    Nexplanon Insertion  Patient given informed consent, signed copy in the chart, time out was performed.  Appropriate time out taken.   Patient's LEFT arm was prepped and draped in the usual sterile fashion. The ruler used to measure and mark insertion area.  Pt was prepped with betadine swab and then injected with 1.0 cc of 2% lidocaine with epinephrine. Nexplanon removed form packaging,  Device confirmed in needle, then inserted full length of needle and withdrawn per handbook instructions.  Pt insertion site covered with steri-strip and a bandage.   Minimal blood loss.  Pt tolerated the procedure welL.  Assessment: Encounter for annual routine gynecological  examination  Cervical cancer screening - Plan: Cytology - PAP  Screening for STD (sexually transmitted disease) - Plan: Cytology - PAP  Nexplanon insertion--BC options discussed, pt would like nexplanon. Has not been sex active since stopping pills. Nexplanon inserted today. She was told to remove the dressing in 12-24 hours, to keep the incision area dry for 24 hours and to remove the Steristrip in 2-3  days.  Notify us if any signs of tenderness, redness, pain, or fevers develop.   No orders of the defined types were placed in this encounter.             GYN counsel adequate intake of calcium and vitamin D, diet and exercise     F/U  No follow-ups on file.  Adithi Gammon B. Drae Mitzel, PA-C 01/31/2023 9:03 PM

## 2023-02-01 ENCOUNTER — Ambulatory Visit (INDEPENDENT_AMBULATORY_CARE_PROVIDER_SITE_OTHER): Payer: Managed Care, Other (non HMO) | Admitting: Obstetrics and Gynecology

## 2023-02-01 ENCOUNTER — Encounter: Payer: Self-pay | Admitting: Obstetrics and Gynecology

## 2023-02-01 VITALS — BP 110/78 | Ht 67.0 in | Wt 220.0 lb

## 2023-02-01 DIAGNOSIS — N926 Irregular menstruation, unspecified: Secondary | ICD-10-CM

## 2023-02-01 DIAGNOSIS — Z01419 Encounter for gynecological examination (general) (routine) without abnormal findings: Secondary | ICD-10-CM

## 2023-02-01 DIAGNOSIS — Z3046 Encounter for surveillance of implantable subdermal contraceptive: Secondary | ICD-10-CM

## 2023-02-01 DIAGNOSIS — L680 Hirsutism: Secondary | ICD-10-CM

## 2023-02-01 DIAGNOSIS — Z113 Encounter for screening for infections with a predominantly sexual mode of transmission: Secondary | ICD-10-CM

## 2023-02-01 MED ORDER — NORETHINDRONE 0.35 MG PO TABS: 1.0000 | ORAL_TABLET | Freq: Every day | ORAL | 0 refills | Status: AC

## 2023-02-01 NOTE — Patient Instructions (Signed)
I value your feedback and you entrusting us with your care. If you get a [facility] patient survey, I would appreciate you taking the time to let us know about your experience today. Thank you! ? ? ?

## 2023-02-04 LAB — CHLAMYDIA/GONOCOCCUS/TRICHOMONAS, NAA
Chlamydia by NAA: NEGATIVE
Gonococcus by NAA: NEGATIVE
Trich vag by NAA: NEGATIVE

## 2023-02-05 LAB — CBC WITH DIFFERENTIAL/PLATELET
Basophils Absolute: 0 10*3/uL (ref 0.0–0.2)
Basos: 0 %
EOS (ABSOLUTE): 0.1 10*3/uL (ref 0.0–0.4)
Eos: 1 %
Hematocrit: 40.9 % (ref 34.0–46.6)
Hemoglobin: 14.1 g/dL (ref 11.1–15.9)
Immature Grans (Abs): 0.1 10*3/uL (ref 0.0–0.1)
Immature Granulocytes: 1 %
Lymphocytes Absolute: 2.5 10*3/uL (ref 0.7–3.1)
Lymphs: 30 %
MCH: 30.7 pg (ref 26.6–33.0)
MCHC: 34.5 g/dL (ref 31.5–35.7)
MCV: 89 fL (ref 79–97)
Monocytes Absolute: 0.7 10*3/uL (ref 0.1–0.9)
Monocytes: 8 %
Neutrophils Absolute: 5.1 10*3/uL (ref 1.4–7.0)
Neutrophils: 60 %
Platelets: 244 10*3/uL (ref 150–450)
RBC: 4.59 x10E6/uL (ref 3.77–5.28)
RDW: 11.4 % — ABNORMAL LOW (ref 11.7–15.4)
WBC: 8.4 10*3/uL (ref 3.4–10.8)

## 2023-02-05 LAB — TESTOSTERONE,FREE AND TOTAL
Testosterone, Free: 5.2 pg/mL — ABNORMAL HIGH (ref 0.0–4.2)
Testosterone: 22 ng/dL (ref 13–71)

## 2023-02-08 ENCOUNTER — Encounter: Payer: Self-pay | Admitting: Obstetrics and Gynecology

## 2024-02-15 ENCOUNTER — Encounter: Payer: Self-pay | Admitting: Obstetrics and Gynecology

## 2024-02-19 NOTE — Progress Notes (Deleted)
 PCP:  Yehuda Helms, MD   No chief complaint on file.    HPI:      [name] is a 28 y.o. G0P0000 who LMP was No LMP recorded. Patient has had an implant., presents today for her annual examination.  Her menses are regular every 28-30 days, lasting 8-10 days with nexplanon , mod flow, occas BTB, mild dysmen improved with NSAIDs. Started OCPs 5/19 for acne; changed to nexplanon  6/22. Had monthly menses prior to OCP use in 2019. Has facial hair on chin/chest and acne with menses. Concerned about PCOS due to hirsutism as well as longer periods with nexplanon . Doesn't think has had new hair follicles with nexplanon , just not improved.    Sex activity: currently sexually active--contraception nexplanon , placed 02/04/21.  Would liek nexplanon  removed and IUD placed today. No hx of HTN, DVTs, migraines with aura. No pain/bleeding. Last Pap: 02/04/21 Results were no abnormalities Hx of STDs: none  There is no FH of breast cancer. There is no FH of ovarian cancer. The patient does not do self-breast exams.  Tobacco use: vapes daily Alcohol use: none No drug use.  Exercise: moderately active  She does not get adequate calcium or Vitamin D in her diet. Concerned about non Fe def anemia due to feeling cold, body aches. Has appt with PCP. Not taking MVI.   Past Medical History:  Diagnosis Date   Acne    Epilepsy (HCC)     Past Surgical History:  Procedure Laterality Date   APPENDECTOMY     LAPAROSCOPIC APPENDECTOMY N/A 01/15/2017   Procedure: APPENDECTOMY LAPAROSCOPIC;  Surgeon: Emmalene Hare, MD;  Location: [facility] ORS;  Service: General;  Laterality: N/A;    Family History  Problem Relation Age of Onset   Deep vein thrombosis Mother    Diabetes Father    Heart failure Father    Renal Disease Father    Heart failure Maternal Grandmother    Pancreatic cancer Maternal Grandfather        late years   Breast cancer Neg Hx    Ovarian cancer Neg Hx     Social History    Socioeconomic History   Marital status: Single    Spouse name: Not on file   Number of children: Not on file   Years of education: Not on file   Highest education level: Not on file  Occupational History   Not on file  Tobacco Use   Smoking status: Never   Smokeless tobacco: Never  Vaping Use   Vaping status: Every Day  Substance and Sexual Activity   Alcohol use: Yes    Comment: occasional   Drug use: No   Sexual activity: Yes    Birth control/protection: Implant  Other Topics Concern   Not on file  Social History Narrative   Not on file   Social Drivers of Health   Financial Resource Strain: Low Risk  (12/19/2023)   Received from [facility]   Overall Financial Resource Strain (CARDIA)    Difficulty of Paying Living Expenses: Not hard at all  Food Insecurity: No Food Insecurity (12/19/2023)   Received from [facility]   Hunger Vital Sign    Within the past 12 months, you worried that your food would run out before you got the money to buy more.: Never true    Within the past 12 months, the food you bought just didn't last and you didn't have money to get more.: Never  true  Transportation Needs: No Transportation Needs (12/19/2023)   Received from [facility] - Transportation    In the past 12 months, has lack of transportation kept you from medical appointments or from getting medications?: No    Lack of Transportation (Non-Medical): No  Physical Activity: Not on file  Stress: Not on file  Social Connections: Not on file  Intimate Partner Violence: Not on file    Outpatient Medications Prior to Visit  Medication Sig Dispense Refill   etonogestrel  (NEXPLANON ) 68 MG IMPL implant 1 each (68 mg total) by Subdermal route once for 1 dose. 1 each 0   norethindrone  (MICRONOR ) 0.35 MG tablet Take 1 tablet (0.35 mg total) by mouth daily. 84 tablet 0   zonisamide  (ZONEGRAN ) 50 MG capsule PLEASE SEE ATTACHED FOR  DETAILED DIRECTIONS     No facility-administered medications prior to visit.      ROS:  Review of Systems  Constitutional:  Negative for fatigue, fever and unexpected weight change.  Respiratory:  Negative for cough, shortness of breath and wheezing.   Cardiovascular:  Negative for chest pain, palpitations and leg swelling.  Gastrointestinal:  Negative for blood in stool, constipation, diarrhea, nausea and vomiting.  Endocrine: Negative for cold intolerance, heat intolerance and polyuria.  Genitourinary:  Negative for dyspareunia, dysuria, flank pain, frequency, genital sores, hematuria, menstrual problem, pelvic pain, urgency, vaginal bleeding, vaginal discharge and vaginal pain.  Musculoskeletal:  Negative for back pain, joint swelling and myalgias.  Skin:  Negative for rash.  Neurological:  Negative for dizziness, syncope, light-headedness, numbness and headaches.  Hematological:  Negative for adenopathy.  Psychiatric/Behavioral:  Negative for agitation, confusion, sleep disturbance and suicidal ideas. The patient is not nervous/anxious.   BREAST: No symptoms   Objective: There were no vitals taken for this visit.   Physical Exam Constitutional:      Appearance: She is well-developed.  Genitourinary:     Vulva normal.     Right Labia: No rash, tenderness or lesions.    Left Labia: No tenderness, lesions or rash.    No vaginal discharge, erythema or tenderness.      Right Adnexa: not tender and no mass present.    Left Adnexa: not tender and no mass present.    No cervical motion tenderness, friability or polyp.     Uterus is not enlarged or tender.  Breasts:    Right: No mass, nipple discharge, skin change or tenderness.     Left: No mass, nipple discharge, skin change or tenderness.  Neck:     Thyroid: No thyromegaly.   Cardiovascular:     Rate and Rhythm: Normal rate and regular rhythm.     Heart sounds: Normal heart sounds. No murmur heard. Pulmonary:      Effort: Pulmonary effort is normal.     Breath sounds: Normal breath sounds.  Abdominal:     Palpations: Abdomen is soft.     Tenderness: There is no abdominal tenderness. There is no guarding or rebound.   Musculoskeletal:        General: Normal range of motion.     Cervical back: Normal range of motion.  Lymphadenopathy:     Cervical: No cervical adenopathy.   Neurological:     General: No focal deficit present.     Mental Status: She is alert and oriented to person, place, and time.     Cranial Nerves: No cranial nerve deficit.   Skin:    General: Skin is  warm and dry.   Psychiatric:        Mood and Affect: Mood normal.        Behavior: Behavior normal.        Thought Content: Thought content normal.        Judgment: Judgment normal.  Vitals reviewed.     Assessment: Encounter for annual routine gynecological examination  Screening for STD (sexually transmitted disease) - Plan: Chlamydia/Gonococcus/Trichomonas, NAA  Encounter for surveillance of implantable subdermal contraceptive--good till 6/25  Hirsutism - Plan: Testosterone ,Free and Total; discussed unable to check for PCOS while on hormones. With that said, will check testosterone  since hirsutism sx should improve with BC. Will rule out other path. If neg, discussed tx of hirsutism with laser/electrolysis and that [facility] won't treat prior hair follicles. Pt understands.   Irregular bleeding - Plan: CBC with Differential/Platelet, norethindrone  (MICRONOR ) 0.35 MG tablet; add Rx POPs. Discussed sx normal with nexplanon  but pt bothered by longer periods. Add MVI. Rule out Fe def anemia. Get folic acid/B12 in MVI.             GYN counsel adequate intake of calcium and vitamin D, diet and exercise     F/U  No follow-ups on file.  Rand Boller B. Mariadejesus Cade, PA-C 02/19/2024 8:21 PM

## 2024-02-20 ENCOUNTER — Ambulatory Visit: Admitting: Obstetrics and Gynecology

## 2024-02-20 ENCOUNTER — Encounter: Payer: Self-pay | Admitting: Obstetrics and Gynecology

## 2024-02-20 ENCOUNTER — Telehealth: Payer: Self-pay | Admitting: Obstetrics and Gynecology

## 2024-02-20 DIAGNOSIS — L680 Hirsutism: Secondary | ICD-10-CM

## 2024-02-20 DIAGNOSIS — Z113 Encounter for screening for infections with a predominantly sexual mode of transmission: Secondary | ICD-10-CM

## 2024-02-20 DIAGNOSIS — Z124 Encounter for screening for malignant neoplasm of cervix: Secondary | ICD-10-CM

## 2024-02-20 DIAGNOSIS — Z30430 Encounter for insertion of intrauterine contraceptive device: Secondary | ICD-10-CM

## 2024-02-20 DIAGNOSIS — Z3046 Encounter for surveillance of implantable subdermal contraceptive: Secondary | ICD-10-CM

## 2024-02-20 DIAGNOSIS — Z01419 Encounter for gynecological examination (general) (routine) without abnormal findings: Secondary | ICD-10-CM

## 2024-02-20 NOTE — Telephone Encounter (Signed)
 Patient called in and needed to reschedule her appt with [facility]. So I called patient so we could rs but she didn't answer, so I left message asking the patient to call the office back.

## 2024-02-26 ENCOUNTER — Encounter: Payer: Self-pay | Admitting: Obstetrics and Gynecology

## 2024-03-26 NOTE — Progress Notes (Deleted)
 PCP:  Auston Reyes BIRCH, MD   No chief complaint on file.    HPI:      [name] is a 28 y.o. G0P0000 who LMP was No LMP recorded. Patient has had an implant., presents today for her annual examination.  Her menses are regular every 28-30 days, lasting 8-10 days with nexplanon , mod flow, occas BTB, mild dysmen improved with NSAIDs. Started OCPs 5/19 for acne; changed to nexplanon  6/22. Had monthly menses prior to OCP use in 2019. Has facial hair on chin/chest and acne with menses. Concerned about PCOS due to hirsutism as well as longer periods with nexplanon . Doesn't think has had new hair follicles with nexplanon , just not improved.    Sex activity: currently sexually active--contraception nexplanon , placed 02/04/21.  No hx of HTN, DVTs, migraines with aura. No pain/bleeding. Last Pap: 02/04/21 Results were no abnormalities Hx of STDs: none  There is no FH of breast cancer. There is no FH of ovarian cancer. The patient does not do self-breast exams.  Tobacco use: vapes daily Alcohol use: none No drug use.  Exercise: moderately active  She does not get adequate calcium or Vitamin D in her diet. Concerned about non Fe def anemia due to feeling cold, body aches. Has appt with PCP. Not taking MVI.   Past Medical History:  Diagnosis Date   Acne    Epilepsy (HCC)     Past Surgical History:  Procedure Laterality Date   APPENDECTOMY     LAPAROSCOPIC APPENDECTOMY N/A 01/15/2017   Procedure: APPENDECTOMY LAPAROSCOPIC;  Surgeon: Desiderio Schanz, MD;  Location: [facility] ORS;  Service: General;  Laterality: N/A;    Family History  Problem Relation Age of Onset   Deep vein thrombosis Mother    Diabetes Father    Heart failure Father    Renal Disease Father    Heart failure Maternal Grandmother    Pancreatic cancer Maternal Grandfather        late years   Breast cancer Neg Hx    Ovarian cancer Neg Hx     Social History   Socioeconomic History   Marital status: Single     Spouse name: Not on file   Number of children: Not on file   Years of education: Not on file   Highest education level: Not on file  Occupational History   Not on file  Tobacco Use   Smoking status: Never   Smokeless tobacco: Never  Vaping Use   Vaping status: Every Day  Substance and Sexual Activity   Alcohol use: Yes    Comment: occasional   Drug use: No   Sexual activity: Yes    Birth control/protection: Implant  Other Topics Concern   Not on file  Social History Narrative   Not on file   Social Drivers of Health   Financial Resource Strain: Low Risk  (12/19/2023)   Received from [facility]   Overall Financial Resource Strain (CARDIA)    Difficulty of Paying Living Expenses: Not hard at all  Food Insecurity: No Food Insecurity (12/19/2023)   Received from [facility]   Hunger Vital Sign    Within the past 12 months, you worried that your food would run out before you got the money to buy more.: Never true    Within the past 12 months, the food you bought just didn't last and you didn't have money to get more.: Never true  Transportation Needs: No Transportation Needs (12/19/2023)  Received from [facility] - Transportation    In the past 12 months, has lack of transportation kept you from medical appointments or from getting medications?: No    Lack of Transportation (Non-Medical): No  Physical Activity: Not on file  Stress: Not on file  Social Connections: Not on file  Intimate Partner Violence: Not on file    Outpatient Medications Prior to Visit  Medication Sig Dispense Refill   etonogestrel  (NEXPLANON ) 68 MG IMPL implant 1 each (68 mg total) by Subdermal route once for 1 dose. 1 each 0   norethindrone  (MICRONOR ) 0.35 MG tablet Take 1 tablet (0.35 mg total) by mouth daily. 84 tablet 0   zonisamide  (ZONEGRAN ) 50 MG capsule PLEASE SEE ATTACHED FOR DETAILED DIRECTIONS     No facility-administered  medications prior to visit.      ROS:  Review of Systems  Constitutional:  Negative for fatigue, fever and unexpected weight change.  Respiratory:  Negative for cough, shortness of breath and wheezing.   Cardiovascular:  Negative for chest pain, palpitations and leg swelling.  Gastrointestinal:  Negative for blood in stool, constipation, diarrhea, nausea and vomiting.  Endocrine: Negative for cold intolerance, heat intolerance and polyuria.  Genitourinary:  Negative for dyspareunia, dysuria, flank pain, frequency, genital sores, hematuria, menstrual problem, pelvic pain, urgency, vaginal bleeding, vaginal discharge and vaginal pain.  Musculoskeletal:  Negative for back pain, joint swelling and myalgias.  Skin:  Negative for rash.  Neurological:  Negative for dizziness, syncope, light-headedness, numbness and headaches.  Hematological:  Negative for adenopathy.  Psychiatric/Behavioral:  Negative for agitation, confusion, sleep disturbance and suicidal ideas. The patient is not nervous/anxious.   BREAST: No symptoms   Objective: There were no vitals taken for this visit.   Physical Exam Constitutional:      Appearance: She is well-developed.  Genitourinary:     Vulva normal.     Right Labia: No rash, tenderness or lesions.    Left Labia: No tenderness, lesions or rash.    No vaginal discharge, erythema or tenderness.      Right Adnexa: not tender and no mass present.    Left Adnexa: not tender and no mass present.    No cervical motion tenderness, friability or polyp.     Uterus is not enlarged or tender.  Breasts:    Right: No mass, nipple discharge, skin change or tenderness.     Left: No mass, nipple discharge, skin change or tenderness.  Neck:     Thyroid: No thyromegaly.  Cardiovascular:     Rate and Rhythm: Normal rate and regular rhythm.     Heart sounds: Normal heart sounds. No murmur heard. Pulmonary:     Effort: Pulmonary effort is normal.     Breath sounds:  Normal breath sounds.  Abdominal:     Palpations: Abdomen is soft.     Tenderness: There is no abdominal tenderness. There is no guarding or rebound.  Musculoskeletal:        General: Normal range of motion.     Cervical back: Normal range of motion.  Lymphadenopathy:     Cervical: No cervical adenopathy.  Neurological:     General: No focal deficit present.     Mental Status: She is alert and oriented to person, place, and time.     Cranial Nerves: No cranial nerve deficit.  Skin:    General: Skin is warm and dry.  Psychiatric:        Mood and  Affect: Mood normal.        Behavior: Behavior normal.        Thought Content: Thought content normal.        Judgment: Judgment normal.  Vitals reviewed.     Nexplanon  removal Procedure note - The Nexplanon  was noted in the patient's arm and the end was identified. The skin was cleansed with a Betadine solution. A small injection of subcutaneous lidocaine  with epinephrine  was given over the end of the implant. An incision was made at the end of the implant. The rod was noted in the incision and grasped with a hemostat. It was noted to be intact.  Steri-Strip was placed approximating the incision. Hemostasis was noted.   IUD Insertion Procedure Note Patient identified, informed consent performed, consent signed.   Discussed risks of irregular bleeding, cramping, infection, malpositioning or misplacement of the IUD outside the uterus which may require further procedure such as laparoscopy, risk of failure <1%. Time out was performed.  Urine pregnancy test negative.***  Speculum placed in the vagina.  Cervix visualized.  Cleaned with Betadine x 2.  Grasped anteriorly with a single tooth tenaculum.  Uterus sounded to *** cm.   IUD placed per manufacturer's recommendations.  Strings trimmed to 3 cm. Tenaculum was removed, good hemostasis noted.  Patient tolerated procedure well.   Assessment: Encounter for annual routine gynecological  examination  Screening for STD (sexually transmitted disease) - Plan: Chlamydia/Gonococcus/Trichomonas, NAA  Encounter for surveillance of implantable subdermal contraceptive--good till 6/25  Hirsutism - Plan: Testosterone ,Free and Total; discussed unable to check for PCOS while on hormones. With that said, will check testosterone  since hirsutism sx should improve with BC. Will rule out other path. If neg, discussed tx of hirsutism with laser/electrolysis and that [facility] won't treat prior hair follicles. Pt understands.   Irregular bleeding - Plan: CBC with Differential/Platelet, norethindrone  (MICRONOR ) 0.35 MG tablet; add Rx POPs. Discussed sx normal with nexplanon  but pt bothered by longer periods. Add MVI. Rule out Fe def anemia. Get folic acid/B12 in MVI.    Patient was given post-procedure instructions.  She was advised to have backup contraception for one week.   Call if you are having increasing pain, cramps or bleeding or if you have a fever greater than 100.4 degrees F., shaking chills, nausea or vomiting. Patient was also asked to check IUD strings periodically and follow up in 4 weeks for IUD check.   GYN counsel adequate intake of calcium and vitamin D, diet and exercise     F/U  No follow-ups on file.  Talley Kreiser B. Kalysta Kneisley, PA-C 03/26/2024 4:42 PM

## 2024-03-28 ENCOUNTER — Ambulatory Visit: Admitting: Obstetrics and Gynecology

## 2024-03-28 DIAGNOSIS — Z01419 Encounter for gynecological examination (general) (routine) without abnormal findings: Secondary | ICD-10-CM

## 2024-03-28 DIAGNOSIS — Z3046 Encounter for surveillance of implantable subdermal contraceptive: Secondary | ICD-10-CM

## 2024-03-28 DIAGNOSIS — Z124 Encounter for screening for malignant neoplasm of cervix: Secondary | ICD-10-CM
